# Patient Record
Sex: Male | Born: 1975 | Race: Black or African American | Hispanic: No | Marital: Married | Smoking: Never smoker
Health system: Southern US, Community
[De-identification: ages and names within clinical notes are randomized; demographics above are authoritative.]

---

## 2013-12-06 ENCOUNTER — Ambulatory Visit (INDEPENDENT_AMBULATORY_CARE_PROVIDER_SITE_OTHER): Payer: BC Managed Care – PPO | Admitting: Sports Medicine

## 2013-12-06 ENCOUNTER — Encounter: Payer: Self-pay | Admitting: Sports Medicine

## 2013-12-06 VITALS — BP 121/80 | HR 71 | Wt 263.0 lb

## 2013-12-06 DIAGNOSIS — Z299 Encounter for prophylactic measures, unspecified: Secondary | ICD-10-CM

## 2013-12-06 DIAGNOSIS — Z23 Encounter for immunization: Secondary | ICD-10-CM

## 2013-12-06 DIAGNOSIS — Z0000 Encounter for general adult medical examination without abnormal findings: Secondary | ICD-10-CM | POA: Insufficient documentation

## 2013-12-06 NOTE — Progress Notes (Signed)
  Subjective:    CC: Establish care/CPE  HPI:  This pleasant 38 year old male has no complaints, no medical problems, and just desires to establish care.  Past medical history, Surgical history, Family history not pertinant except as noted below, Social history, Allergies, and medications have been entered into the medical record, reviewed, and no changes needed.   Review of Systems: No headache, visual changes, nausea, vomiting, diarrhea, constipation, dizziness, abdominal pain, skin rash, fevers, chills, night sweats, swollen lymph nodes, weight loss, chest pain, body aches, joint swelling, muscle aches, shortness of breath, mood changes, visual or auditory hallucinations.  Objective:    General: Well Developed, well nourished, and in no acute distress.  Neuro: Alert and oriented x3, extra-ocular muscles intact, sensation grossly intact.  HEENT: Normocephalic, atraumatic, pupils equal round reactive to light, neck supple, no masses, no lymphadenopathy, thyroid nonpalpable. Oropharynx, nasopharynx, external ear canals are unremarkable. Skin: Warm and dry, no rashes noted.  Cardiac: Regular rate and rhythm, no murmurs rubs or gallops.  Respiratory: Clear to auscultation bilaterally. Not using accessory muscles, speaking in full sentences.  Abdominal: Soft, nontender, nondistended, positive bowel sounds, no masses, no organomegaly.  Musculoskeletal: Shoulder, elbow, wrist, hip, knee, ankle stable, and with full range of motion.  Impression and Recommendations:    The patient was counselled, risk factors were discussed, anticipatory guidance given.

## 2013-12-06 NOTE — Assessment & Plan Note (Signed)
This is a healthy male with no complaints, checking routine blood work. Tdap. Complete physical performed today. Return to see me in one year.

## 2015-01-31 ENCOUNTER — Encounter: Payer: Self-pay | Admitting: Sports Medicine

## 2015-01-31 ENCOUNTER — Ambulatory Visit (INDEPENDENT_AMBULATORY_CARE_PROVIDER_SITE_OTHER): Payer: BLUE CROSS/BLUE SHIELD | Admitting: Sports Medicine

## 2015-01-31 VITALS — BP 109/65 | HR 70 | Ht 76.0 in | Wt 262.0 lb

## 2015-01-31 DIAGNOSIS — B360 Pityriasis versicolor: Secondary | ICD-10-CM | POA: Diagnosis not present

## 2015-01-31 DIAGNOSIS — Z0000 Encounter for general adult medical examination without abnormal findings: Secondary | ICD-10-CM

## 2015-01-31 MED ORDER — FLUCONAZOLE 150 MG PO TABS: ORAL_TABLET | ORAL | Status: DC

## 2015-01-31 NOTE — Assessment & Plan Note (Signed)
Fluconazole 300 mg weekly for 4 weeks. Return in 2 months to recheck rash.

## 2015-01-31 NOTE — Progress Notes (Signed)
  Subjective:    CC: CPE  HPI:  This pleasant 39 year old male has no complaints, he is here for a physical.  Past medical history, Surgical history, Family history not pertinant except as noted below, Social history, Allergies, and medications have been entered into the medical record, reviewed, and no changes needed.   Review of Systems: No headache, visual changes, nausea, vomiting, diarrhea, constipation, dizziness, abdominal pain, skin rash, fevers, chills, night sweats, swollen lymph nodes, weight loss, chest pain, body aches, joint swelling, muscle aches, shortness of breath, mood changes, visual or auditory hallucinations.  Objective:    General: Well Developed, well nourished, and in no acute distress.  Neuro: Alert and oriented x3, extra-ocular muscles intact, sensation grossly intact.  HEENT: Normocephalic, atraumatic, pupils equal round reactive to light, neck supple, no masses, no lymphadenopathy, thyroid nonpalpable.  Skin: Warm and dry, there is a splotchy, hypopigmented rash on his upper shoulders and back, no pruritus, no excoriations. There is also a subcentimeter circular dark lesion on his right palm. Well-defined borders, however asymmetric and irregular. There is also a dome-shaped lesion on his scalp, it is unchanged over years. Nontender. Appears to be a small sebaceous cyst versus ingrown hair. Cardiac: Regular rate and rhythm, no murmurs rubs or gallops.  Respiratory: Clear to auscultation bilaterally. Not using accessory muscles, speaking in full sentences.  Abdominal: Soft, nontender, nondistended, positive bowel sounds, no masses, no organomegaly.  Musculoskeletal: Shoulder, elbow, wrist, hip, knee, ankle stable, and with full range of motion.  Impression and Recommendations:    The patient was counselled, risk factors were discussed, anticipatory guidance given.

## 2015-01-31 NOTE — Patient Instructions (Signed)
Tinea Versicolor Tinea versicolor is a common yeast infection of the skin. This condition becomes known when the yeast on our skin starts to overgrow (yeast is a normal inhabitant on our skin). This condition is noticed as white or light brown patches on brown skin, and is more evident in the summer on tanned skin. These areas are slightly scaly if scratched. The light patches from the yeast become evident when the yeast creates "holes in your suntan". This is most often noticed in the summer. The patches are usually located on the chest, back, pubis, neck and body folds. However, it may occur on any area of body. Mild itching and inflammation (redness or soreness) may be present. DIAGNOSIS  The diagnosisof this is made clinically (by looking). Cultures from samples are usually not needed. Examination under the microscope may help. However, yeast is normally found on skin. The diagnosis still remains clinical. Examination under Wood's Ultraviolet Light can determine the extent of the infection. TREATMENT  This common infection is usually only of cosmetic (only a concern to your appearance). It is easily treated with dandruff shampoo used during showers or bathing. Vigorous scrubbing will eliminate the yeast over several days time. The light areas in your skin may remain for weeks or months after the infection is cured unless your skin is exposed to sunlight. The lighter or darker spots caused by the fungus that remain after complete treatment are not a sign of treatment failure; it will take a long time to resolve. Your caregiver may recommend a number of commercial preparations or medication by mouth if home care is not working. Recurrence is common and preventative medication may be necessary. This skin condition is not highly contagious. Special care is not needed to protect close friends and family members. Normal hygiene is usually enough. Follow up is required only if you develop complications (such as a  secondary infection from scratching), if recommended by your caregiver, or if no relief is obtained from the preparations used. Document Released: 11/12/2000 Document Revised: 02/07/2012 Document Reviewed: 12/25/2008 [employer] Patient Information 2015 [employer]. This information is not intended to replace advice given to you by your health care provider. Make sure you discuss any questions you have with your health care provider.  

## 2015-01-31 NOTE — Assessment & Plan Note (Addendum)
Annual physical exam as above. He needs to get blood work done. We did notice a small minimally so suspicious lesion on his right hand, palm. He will take a picture of this and we can check it as return visit.

## 2015-03-31 ENCOUNTER — Ambulatory Visit (INDEPENDENT_AMBULATORY_CARE_PROVIDER_SITE_OTHER): Payer: BLUE CROSS/BLUE SHIELD | Admitting: Sports Medicine

## 2015-03-31 ENCOUNTER — Encounter: Payer: Self-pay | Admitting: Sports Medicine

## 2015-03-31 VITALS — BP 119/79 | HR 96 | Ht 76.0 in | Wt 260.0 lb

## 2015-03-31 DIAGNOSIS — B360 Pityriasis versicolor: Secondary | ICD-10-CM | POA: Diagnosis not present

## 2015-03-31 MED ORDER — FLUCONAZOLE 150 MG PO TABS: ORAL_TABLET | ORAL | Status: DC

## 2015-03-31 NOTE — Progress Notes (Signed)
  Subjective:    CC: Follow-up  HPI: Tinea versicolor: Improved however patient does endorse missing some doses.  Annual physical exam: Blood work has not yet been done, patient plans to do it today.  Past medical history, Surgical history, Family history not pertinant except as noted below, Social history, Allergies, and medications have been entered into the medical record, reviewed, and no changes needed.   Review of Systems: No fevers, chills, night sweats, weight loss, chest pain, or shortness of breath.   Objective:    General: Well Developed, well nourished, and in no acute distress.  Neuro: Alert and oriented x3, extra-ocular muscles intact, sensation grossly intact.  HEENT: Normocephalic, atraumatic, pupils equal round reactive to light, neck supple, no masses, no lymphadenopathy, thyroid nonpalpable.  Skin: Warm and dry, there are persistent hypopigmented patches over his back. Cardiac: Regular rate and rhythm, no murmurs rubs or gallops, no lower extremity edema.  Respiratory: Clear to auscultation bilaterally. Not using accessory muscles, speaking in full sentences.  Impression and Recommendations:

## 2015-03-31 NOTE — Assessment & Plan Note (Signed)
Has missed a couple doses, rash looks a little bit lighter. Adding 2 more weeks. Able take a picture of it with her cell phone today, and then again in a month, and then compare.

## 2015-04-01 ENCOUNTER — Other Ambulatory Visit: Payer: Self-pay | Admitting: Sports Medicine

## 2015-04-01 LAB — COMPREHENSIVE METABOLIC PANEL WITH GFR
Albumin: 4.5 g/dL (ref 3.5–5.2)
Alkaline Phosphatase: 58 U/L (ref 39–117)
BUN: 15 mg/dL (ref 6–23)
Calcium: 9.7 mg/dL (ref 8.4–10.5)
Chloride: 102 meq/L (ref 96–112)
Potassium: 4.2 meq/L (ref 3.5–5.3)
Sodium: 137 meq/L (ref 135–145)
Total Protein: 7.6 g/dL (ref 6.0–8.3)

## 2015-04-01 LAB — CBC
HCT: 46.1 % (ref 39.0–52.0)
Hemoglobin: 15.5 g/dL (ref 13.0–17.0)
MCH: 28.4 pg (ref 26.0–34.0)
MCHC: 33.6 g/dL (ref 30.0–36.0)
MCV: 84.6 fL (ref 78.0–100.0)
MPV: 12.9 fL — ABNORMAL HIGH (ref 8.6–12.4)
Platelets: 175 K/uL (ref 150–400)
RBC: 5.45 MIL/uL (ref 4.22–5.81)
RDW: 14.1 % (ref 11.5–15.5)
WBC: 3.9 K/uL — ABNORMAL LOW (ref 4.0–10.5)

## 2015-04-01 LAB — LIPID PANEL
Cholesterol: 186 mg/dL (ref 0–200)
HDL: 44 mg/dL (ref >=40)
LDL Cholesterol: 130 mg/dL — ABNORMAL HIGH (ref 0–99)
Total CHOL/HDL Ratio: 4.2 Ratio
Triglycerides: 62 mg/dL (ref <150)
VLDL: 12 mg/dL (ref 0–40)

## 2015-04-01 LAB — HEMOGLOBIN A1C
Hgb A1c MFr Bld: 5.7 % — ABNORMAL HIGH (ref <5.7)
Mean Plasma Glucose: 117 mg/dL — ABNORMAL HIGH (ref <117)

## 2015-04-01 LAB — VITAMIN D 25 HYDROXY (VIT D DEFICIENCY, FRACTURES): Vit D, 25-Hydroxy: 22 ng/mL — ABNORMAL LOW (ref 30–100)

## 2015-04-01 LAB — TSH: TSH: 0.97 u[IU]/mL (ref 0.350–4.500)

## 2015-04-01 LAB — COMPREHENSIVE METABOLIC PANEL
ALT: 15 U/L (ref 0–53)
AST: 18 U/L (ref 0–37)
CO2: 26 mEq/L (ref 19–32)
Creat: 1.38 mg/dL — ABNORMAL HIGH (ref 0.50–1.35)
Glucose, Bld: 99 mg/dL (ref 70–99)
Total Bilirubin: 0.7 mg/dL (ref 0.2–1.2)

## 2015-04-01 MED ORDER — VITAMIN D (ERGOCALCIFEROL) 1.25 MG (50000 UNIT) PO CAPS: 50000.0000 [IU] | ORAL_CAPSULE | ORAL | Status: DC

## 2015-10-06 ENCOUNTER — Ambulatory Visit: Payer: BLUE CROSS/BLUE SHIELD | Admitting: Sports Medicine

## 2016-02-06 ENCOUNTER — Encounter: Payer: Self-pay | Admitting: Sports Medicine

## 2016-02-06 ENCOUNTER — Ambulatory Visit (INDEPENDENT_AMBULATORY_CARE_PROVIDER_SITE_OTHER): Payer: BLUE CROSS/BLUE SHIELD | Admitting: Sports Medicine

## 2016-02-06 DIAGNOSIS — Z0000 Encounter for general adult medical examination without abnormal findings: Secondary | ICD-10-CM | POA: Diagnosis not present

## 2016-02-06 LAB — COMPREHENSIVE METABOLIC PANEL
ALT: 17 U/L (ref 9–46)
AST: 18 U/L (ref 10–40)
Albumin: 4.1 g/dL (ref 3.6–5.1)
Alkaline Phosphatase: 46 U/L (ref 40–115)
CO2: 23 mmol/L (ref 20–31)
Calcium: 9.4 mg/dL (ref 8.6–10.3)
Glucose, Bld: 87 mg/dL (ref 65–99)
Potassium: 4.2 mmol/L (ref 3.5–5.3)
Total Bilirubin: 0.6 mg/dL (ref 0.2–1.2)
Total Protein: 6.8 g/dL (ref 6.1–8.1)

## 2016-02-06 LAB — CBC
HCT: 41.9 % (ref 39.0–52.0)
Hemoglobin: 14 g/dL (ref 13.0–17.0)
MCH: 28.4 pg (ref 26.0–34.0)
MCHC: 33.4 g/dL (ref 30.0–36.0)
MCV: 85 fL (ref 78.0–100.0)
MPV: 12.8 fL — ABNORMAL HIGH (ref 8.6–12.4)
Platelets: 159 K/uL (ref 150–400)
RBC: 4.93 MIL/uL (ref 4.22–5.81)
RDW: 13.8 % (ref 11.5–15.5)
WBC: 5.6 K/uL (ref 4.0–10.5)

## 2016-02-06 LAB — LIPID PANEL
Cholesterol: 173 mg/dL (ref 125–200)
HDL: 42 mg/dL (ref >=40)
LDL Cholesterol: 120 mg/dL (ref <130)
Total CHOL/HDL Ratio: 4.1 ratio (ref <=5.0)
Triglycerides: 56 mg/dL (ref <150)
VLDL: 11 mg/dL (ref <30)

## 2016-02-06 LAB — COMPREHENSIVE METABOLIC PANEL WITH GFR
BUN: 17 mg/dL (ref 7–25)
Chloride: 104 mmol/L (ref 98–110)
Creat: 1.39 mg/dL — ABNORMAL HIGH (ref 0.60–1.35)
Sodium: 139 mmol/L (ref 135–146)

## 2016-02-06 LAB — TSH: TSH: 0.78 mIU/L (ref 0.40–4.50)

## 2016-02-06 NOTE — Assessment & Plan Note (Signed)
Complete physical as above, routine blood work ordered, return in one year.

## 2016-02-06 NOTE — Progress Notes (Addendum)
  Subjective:    CC: complete physical  HPI:  This is a pleasant 40 year old male, he is here for his physical and has no complaints.  Past medical history, Surgical history, Family history not pertinant except as noted below, Social history, Allergies, and medications have been entered into the medical record, reviewed, and no changes needed.   Review of Systems: No headache, visual changes, nausea, vomiting, diarrhea, constipation, dizziness, abdominal pain, skin rash, fevers, chills, night sweats, swollen lymph nodes, weight loss, chest pain, body aches, joint swelling, muscle aches, shortness of breath, mood changes, visual or auditory hallucinations.  Objective:    General: Well Developed, well nourished, and in no acute distress.  Neuro: Alert and oriented x3, extra-ocular muscles intact, sensation grossly intact. Cranial nerves II through XII are intact, motor, sensory, and coordinative functions are all intact. HEENT: Normocephalic, atraumatic, pupils equal round reactive to light, neck supple, no masses, no lymphadenopathy, thyroid nonpalpable. Oropharynx, nasopharynx, external ear canals are unremarkable. Skin: Warm and dry, no rashes noted.  Cardiac: Regular rate and rhythm, no murmurs rubs or gallops.  Respiratory: Clear to auscultation bilaterally. Not using accessory muscles, speaking in full sentences.  Abdominal: Soft, nontender, nondistended, positive bowel sounds, no masses, no organomegaly.  Musculoskeletal: Shoulder, elbow, wrist, hip, knee, ankle stable, and with full range of motion.  Impression and Recommendations:    The patient was counselled, risk factors were discussed, anticipatory guidance given.

## 2016-02-07 LAB — HEMOGLOBIN A1C
Hgb A1c MFr Bld: 5.7 % — ABNORMAL HIGH (ref <5.7)
Mean Plasma Glucose: 117 mg/dL — ABNORMAL HIGH (ref <117)

## 2016-02-07 LAB — VITAMIN D 25 HYDROXY (VIT D DEFICIENCY, FRACTURES): Vit D, 25-Hydroxy: 17 ng/mL — ABNORMAL LOW (ref 30–100)

## 2016-02-09 MED ORDER — VITAMIN D (ERGOCALCIFEROL) 1.25 MG (50000 UNIT) PO CAPS: 50000.0000 [IU] | ORAL_CAPSULE | ORAL | Status: DC

## 2016-02-09 NOTE — Addendum Note (Signed)
Addended by: Monica BectonHEKKEKANDAM, Jakel Alphin J on: 02/09/2016 09:03 AM   Modules accepted: Orders

## 2016-03-30 ENCOUNTER — Encounter: Payer: Self-pay | Admitting: Sports Medicine

## 2016-03-30 ENCOUNTER — Ambulatory Visit (INDEPENDENT_AMBULATORY_CARE_PROVIDER_SITE_OTHER): Payer: BLUE CROSS/BLUE SHIELD | Admitting: Sports Medicine

## 2016-03-30 VITALS — BP 125/80 | HR 60 | Resp 18 | Wt 272.2 lb

## 2016-03-30 DIAGNOSIS — L259 Unspecified contact dermatitis, unspecified cause: Secondary | ICD-10-CM | POA: Diagnosis not present

## 2016-03-30 DIAGNOSIS — Z0000 Encounter for general adult medical examination without abnormal findings: Secondary | ICD-10-CM | POA: Diagnosis not present

## 2016-03-30 DIAGNOSIS — S76302A Unspecified injury of muscle, fascia and tendon of the posterior muscle group at thigh level, left thigh, initial encounter: Secondary | ICD-10-CM | POA: Diagnosis not present

## 2016-03-30 MED ORDER — TRIAMCINOLONE ACETONIDE 0.5 % EX CREA: 1.0000 "application " | TOPICAL_CREAM | Freq: Two times a day (BID) | CUTANEOUS | Status: DC

## 2016-03-30 NOTE — Assessment & Plan Note (Addendum)
Patient should strap this with compressive dressing,   And do hamstring rehabilitation exercises.

## 2016-03-30 NOTE — Assessment & Plan Note (Signed)
His friend had some issues, with a thin heart wall, no further details. Brandon Wood is understandably worried about himself though he is completely asymptomatic.  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

## 2016-03-30 NOTE — Progress Notes (Signed)
  Subjective:    CC: Follow-up  HPI: This is a pleasant 40 year old male, for the past week he's had an on rash on the left side of his arm, he is not sure what happened but it looks like he scratched his arm on something, since then it's been minimally itchy, without pain.  Left hamstring injury: Has been working out heavily, has had a steady increase in pain in his mid left hamstring, mild, persistent, no radiation, no overt trauma.  Worried well:  His friend had some issues, with a thin heart wall, no further details. Kaz is understandably worried about himself though he is completely asymptomatic.  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  Past medical history, Surgical history, Family history not pertinant except as noted below, Social history, Allergies, and medications have been entered into the medical record, reviewed, and no changes needed.   Review of Systems: No fevers, chills, night sweats, weight loss, chest pain, or shortness of breath.   Objective:    General: Well Developed, well nourished, and in no acute distress.  Neuro: Alert and oriented x3, extra-ocular muscles intact, sensation grossly intact.  HEENT: Normocephalic, atraumatic, pupils equal round reactive to light, neck supple, no masses, no lymphadenopathy, thyroid nonpalpable.  Skin: Warm and dry, no rashes. Cardiac: Regular rate and rhythm, no murmurs rubs or gallops, no lower extremity edema.  Respiratory: Clear to auscultation bilaterally. Not using accessory muscles, speaking in full sentences. Left Knee: Normal to inspection with no erythema or effusion or obvious bony abnormalities. Palpation normal with no warmth or joint line tenderness or patellar tenderness or condyle tenderness. ROM normal in flexion and extension and lower leg rotation. Ligaments with solid consistent endpoints including ACL, PCL, LCL,  MCL. Negative Mcmurray's and provocative meniscal tests. Non painful patellar compression. Patellar and quadriceps tendons unremarkable. Hamstring and quadriceps strength is normal. Tender to palpation along the mid hamstring. Strength is normal but there is pain with resisted knee flexion.  Impression and Recommendations:

## 2016-03-30 NOTE — Assessment & Plan Note (Signed)
Triamcinolone cream.

## 2016-04-27 ENCOUNTER — Ambulatory Visit (INDEPENDENT_AMBULATORY_CARE_PROVIDER_SITE_OTHER): Payer: BLUE CROSS/BLUE SHIELD | Admitting: Sports Medicine

## 2016-04-27 VITALS — BP 113/74 | HR 55 | Resp 18 | Wt 269.6 lb

## 2016-04-27 DIAGNOSIS — L259 Unspecified contact dermatitis, unspecified cause: Secondary | ICD-10-CM | POA: Diagnosis not present

## 2016-04-27 DIAGNOSIS — S76302D Unspecified injury of muscle, fascia and tendon of the posterior muscle group at thigh level, left thigh, subsequent encounter: Secondary | ICD-10-CM | POA: Diagnosis not present

## 2016-04-27 NOTE — Assessment & Plan Note (Signed)
Resolved with rehabilitation exercises 

## 2016-04-27 NOTE — Assessment & Plan Note (Signed)
Resolved with triamcinolone cream

## 2016-04-27 NOTE — Progress Notes (Signed)
  Subjective:    CC: Follow-up  HPI: Hamstring strain: Resolved.  Skin rash: Resolved.  Past medical history, Surgical history, Family history not pertinant except as noted below, Social history, Allergies, and medications have been entered into the medical record, reviewed, and no changes needed.   Review of Systems: No fevers, chills, night sweats, weight loss, chest pain, or shortness of breath.   Objective:    General: Well Developed, well nourished, and in no acute distress.  Neuro: Alert and oriented x3, extra-ocular muscles intact, sensation grossly intact.  HEENT: Normocephalic, atraumatic, pupils equal round reactive to light, neck supple, no masses, no lymphadenopathy, thyroid nonpalpable.  Skin: Warm and dry, no rashes. Cardiac: Regular rate and rhythm, no murmurs rubs or gallops, no lower extremity edema.  Respiratory: Clear to auscultation bilaterally. Not using accessory muscles, speaking in full sentences.  Impression and Recommendations:

## 2016-08-20 ENCOUNTER — Encounter: Payer: Self-pay | Admitting: Sports Medicine

## 2016-08-20 ENCOUNTER — Ambulatory Visit (INDEPENDENT_AMBULATORY_CARE_PROVIDER_SITE_OTHER): Payer: BLUE CROSS/BLUE SHIELD | Admitting: Sports Medicine

## 2016-08-20 ENCOUNTER — Ambulatory Visit (INDEPENDENT_AMBULATORY_CARE_PROVIDER_SITE_OTHER): Payer: BLUE CROSS/BLUE SHIELD

## 2016-08-20 DIAGNOSIS — M2241 Chondromalacia patellae, right knee: Secondary | ICD-10-CM | POA: Diagnosis not present

## 2016-08-20 DIAGNOSIS — M25561 Pain in right knee: Secondary | ICD-10-CM | POA: Diagnosis not present

## 2016-08-20 DIAGNOSIS — M25562 Pain in left knee: Secondary | ICD-10-CM | POA: Diagnosis not present

## 2016-08-20 MED ORDER — DICLOFENAC SODIUM 75 MG PO TBEC: 75.0000 mg | DELAYED_RELEASE_TABLET | Freq: Two times a day (BID) | ORAL | 3 refills | Status: DC

## 2016-08-20 NOTE — Progress Notes (Signed)
  Subjective:    CC: Right knee pain  HPI: For a few weeks now this pleasant 40 year old male has had increasing pain and swelling behind the right kneecap, worse going up and down stairs and bending his knee, moderate, persistent without radiation or mechanical symptoms, no trauma, no constitutional symptoms.  Past medical history:  Negative.  See flowsheet/record as well for more information.  Surgical history: Negative.  See flowsheet/record as well for more information.  Family history: Negative.  See flowsheet/record as well for more information.  Social history: Negative.  See flowsheet/record as well for more information.  Allergies, and medications have been entered into the medical record, reviewed, and no changes needed.   Review of Systems: No fevers, chills, night sweats, weight loss, chest pain, or shortness of breath.   Objective:    General: Well Developed, well nourished, and in no acute distress.  Neuro: Alert and oriented x3, extra-ocular muscles intact, sensation grossly intact.  HEENT: Normocephalic, atraumatic, pupils equal round reactive to light, neck supple, no masses, no lymphadenopathy, thyroid nonpalpable.  Skin: Warm and dry, no rashes. Cardiac: Regular rate and rhythm, no murmurs rubs or gallops, no lower extremity edema.  Respiratory: Clear to auscultation bilaterally. Not using accessory muscles, speaking in full sentences. Right Knee: Visibly swollen with a palpable fluid wave, tenderness at the medial and lateral patellar facets ROM normal in flexion and extension and lower leg rotation. Ligaments with solid consistent endpoints including ACL, PCL, LCL, MCL. Negative Mcmurray's and provocative meniscal tests. Painful patellar compression with crepitus Patellar and quadriceps tendons unremarkable. Hamstring and quadriceps strength is normal.  Procedure: Real-time Ultrasound Guided aspiration/injection of right knee Device: GE Logiq E  Verbal informed  consent obtained.  Time-out conducted.  Noted no overlying erythema, induration, or other signs of local infection.  Skin prepped in a sterile fashion.  Local anesthesia: Topical Ethyl chloride.  With sterile technique and under real time ultrasound guidance:  Aspirated 7 mL straw-colored fluid, syringe switched and 1 mL kenalog 40, 2 mL lidocaine, 2 mL Marcaine injected easily. Completed without difficulty  Pain immediately resolved suggesting accurate placement of the medication.  Advised to call if fevers/chills, erythema, induration, drainage, or persistent bleeding.  Images permanently stored and available for review in the ultrasound unit.  Impression: Technically successful ultrasound guided injection.  X-rays personally reviewed and show mild medial joint space narrowing  Impression and Recommendations:    Chondromalacia of right patellofemoral joint Aspiration and injection. X-rays. Physical therapy. Diclofenac. Return to see me in one month.

## 2016-08-20 NOTE — Assessment & Plan Note (Signed)
Aspiration and injection. X-rays. Physical therapy. Diclofenac. Return to see me in one month.

## 2016-08-30 ENCOUNTER — Encounter: Payer: Self-pay | Admitting: Rehabilitative and Restorative Service Providers"

## 2016-08-30 ENCOUNTER — Ambulatory Visit (INDEPENDENT_AMBULATORY_CARE_PROVIDER_SITE_OTHER): Payer: BLUE CROSS/BLUE SHIELD | Admitting: Rehabilitative and Restorative Service Providers"

## 2016-08-30 DIAGNOSIS — M25561 Pain in right knee: Secondary | ICD-10-CM

## 2016-08-30 DIAGNOSIS — R293 Abnormal posture: Secondary | ICD-10-CM | POA: Diagnosis not present

## 2016-08-30 DIAGNOSIS — R29898 Other symptoms and signs involving the musculoskeletal system: Secondary | ICD-10-CM | POA: Diagnosis not present

## 2016-08-30 NOTE — Therapy (Signed)
[facility] [address] [place], [place], [address] Phone: [phone]   Fax:  [phone]  Physical Therapy Evaluation  Patient Details  Name: Brandon Wood MRN: [id number] Date of Birth: 1976-07-30 Referring Provider: Dr Benjamin Stain   Encounter Date: 08/30/2016      PT End of Session - 08/30/16 0845    Visit Number 1   Number of Visits 12   Date for PT Re-Evaluation 10/11/16   PT Start Time 0845   PT Stop Time 0932   PT Time Calculation (min) 47 min   Activity Tolerance Patient tolerated treatment well      History reviewed. No pertinent past medical history.  History reviewed. No pertinent surgical history.  There were no vitals filed for this visit.       Subjective Assessment - 08/30/16 0847    Subjective Patient reports that he was jogging on the treadmill 08/18/16 when he felt a sharp pain in the Rt knee - which did not resolv. Seen by MD and referred to PT. Still having some pain in the Rt knee with running on the treadmill and some with squats. Notices some discomfort during the day when he is not taking the medication.    Pertinent History intermittent knee pain  - mild    Diagnostic tests Xrays - mild arthritis    Patient Stated Goals return to normal activities without knee pain    Currently in Pain? Yes   Pain Score 1    Pain Location Knee   Pain Orientation Right   Pain Descriptors / Indicators Sore   Pain Type Acute pain   Pain Onset 1 to 4 weeks ago   Pain Frequency Intermittent   Aggravating Factors  running on the treadmill; squats; ascending or descending stairs; moving sit to stand    Pain Relieving Factors medication; rest             [facility] PT Assessment - 08/30/16 0001      Assessment   Medical Diagnosis chondromalacia patella Rt    Referring Provider Dr Benjamin Stain    Onset Date/Surgical Date 08/18/16   Hand Dominance Left   Next MD Visit 09/17/16   Prior Therapy none      Precautions   Precautions None     Balance Screen   Has the patient fallen in the past 6 months No   Has the patient had a decrease in activity level because of a fear of falling?  No   Is the patient reluctant to leave their home because of a fear of falling?  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    Leisure working out at [employer] 3-4 days/wk inc treadmill and machines; yard work     Observation/Other Assessments   Focus on Therapeutic Outcomes (FOTO)  34% limitation      Sensation   Additional Comments WFL's per pt report - one episode of numbness in the Lt LE a few weeks ago resolved in a few seconds      Posture/Postural Control   Posture Comments standing with knees hyoerextended bilaterally Rt > Lt      AROM   Overall AROM Comments WFL's bilat LE's      Strength   Overall Strength Comments 5/5 bilat LE's  Flexibility   Hamstrings tight ~ 75 deg    Quadriceps tight bilat Rt > Lt    ITB tight Rt > Lt    Piriformis tight Rt > Lt      Palpation   Patella mobility laterally resting and tracking patella    Palpation comment tight lateral quad through muscle belly and into the insertion at patella Rt > Lt      Special Tests    Special Tests --  pain with patella grind Rt > Lt                    [facility] Adult PT Treatment/Exercise - 08/30/16 0001      Therapeutic Activites    Therapeutic Activities --  myofacial and deep tissue work through lateral quad      Neuro Re-ed    Neuro Re-ed Details  working on posture and alignment standing without hyperextension on knees      Knee/Hip Exercises: Stretches   Passive Hamstring Stretch 3 reps;30 seconds   Quad Stretch 3 reps;30 seconds   Hip Flexor Stretch 2 reps;30 seconds  supine LE off edge of bed   ITB Stretch 2 reps;30 seconds    Piriformis Stretch 3 reps;30 seconds                PT Education - 08/30/16 0923    Education provided Yes   Education Details HEP - standint without locking knees    Person(s) Educated Patient   Methods Explanation;Demonstration;Tactile cues;Verbal cues;Handout   Comprehension Verbalized understanding;Returned demonstration;Verbal cues required;Tactile cues required             PT Long Term Goals - 08/30/16 0847      PT LONG TERM GOAL #1   Title Improve FOTO to </= 25% limitation 10/11/16   Time 6   Period Weeks   Status New     PT LONG TERM GOAL #2   Title Independent in HEP 10/11/16   Time 6   Period Weeks   Status New     PT LONG TERM GOAL #3   Title Improve standing posture and alignment with patient to demonstrated standing wituout hyperextension of knees 10/11/16   Time 6   Period Weeks   Status New     PT LONG TERM GOAL #4   Title Improve patella tracking to decrease lateral tracking 10/11/16   Time 6   Period Weeks   Status New     PT LONG TERM GOAL #5   Title Return to normal functional activity including workout at the gym without pain/limitation 10/11/16   Time 6   Period Weeks   Status New               Plan - 08/30/16 1055    Clinical Impression Statement Patient presents with bilat knee pain/discomfort Rt > Lt. He stands with knees hyperextended Rt > Lt. Lateral quad and IT band are tight with lateral position and lateral tracking patella noted Rt > Lt. There is tightness through LE musculature and pain with jogging; walking; stairs; squats; some exercise at gym.    Rehab Potential Good   PT Frequency 2x / week   PT Duration 6 weeks   PT Treatment/Interventions Patient/family education;ADLs/Self Care Home Management;Neuromuscular re-education;Cryotherapy;Electrical Stimulation;Iontophoresis 4mg /ml Dexamethasone;Moist Heat;Traction;Ultrasound;Dry needling;Manual techniques;Therapeutic activities;Therapeutic exercise   PT Next  Visit Plan taping for lateral tracking patella(pt to shave knees); progress with stretching lateral hip/knee structures; strengthening medial quad; neuromuscular re-ed avoiding hyperextension  of knees in standing; may consider TDN for lateral distal quad - not mentioned to pt - will try release work with edge tool and/or manually(pt will work on myofacial release work for lateral quad at home with the stick and golf ball)   Consulted and Agree with Plan of Care Patient      Patient will benefit from skilled therapeutic intervention in order to improve the following deficits and impairments:  Postural dysfunction, Improper body mechanics, Pain, Decreased range of motion, Decreased mobility, Increased muscle spasms, Decreased activity tolerance  Visit Diagnosis: Acute pain of right knee - Plan: PT plan of care cert/re-cert  Other symptoms and signs involving the musculoskeletal system - Plan: PT plan of care cert/re-cert  Abnormal posture - Plan: PT plan of care cert/re-cert     Problem List Patient Active Problem List   Diagnosis Date Noted  . Chondromalacia of right patellofemoral joint 08/20/2016  . Contact dermatitis 03/30/2016  . Left hamstring injury 03/30/2016  . Tinea versicolor 01/31/2015  . Annual physical exam 12/06/2013    Taya Ashbaugh Rober Minion PT, MPH  08/30/2016, 11:08 AM  [facility] [address] [place], [place], [address] Phone: [phone]   Fax:  [phone]  Name: Zorawar Strollo MRN: [id number] Date of Birth: 1976-04-07

## 2016-08-30 NOTE — Patient Instructions (Signed)
Stretch out strap - check [employer]  The stick   HIP: Hamstrings - Supine   Place strap around foot. Raise leg up, keeping knee straight.  Bend opposite knee to protect back if indicated. Hold 30 seconds. 3 reps per set, 2-3 sets per day     Outer Hip Stretch: Reclined IT Band Stretch (Strap)   Strap around one foot, pull leg across body until you feel a pull or stretch, with shoulders on mat. Hold for 30 seconds. Repeat 3 times each leg. 2-3 times/day.  Piriformis Stretch   Lying on back, pull right knee toward opposite shoulder. Hold 30 seconds. Repeat 3 times. Do 2-3 sessions per day.   Quads / HF, Supine   Lie near edge of bed, pull both knees up toward chest. Hold one knee as you drop the other leg off the edge of the bed.  Relax hanging knee/can bend knee back if indicated. Hold 30 seconds. Repeat 3 times per session. Do 2-3 sessions per day.    Quads / HF, Prone   Lie face down. Grasp one ankle with same-side hand. Use towel if needed to reach. Gently pull foot toward buttock.  Hold 30 seconds. Repeat 3 times per session. Do 2-3 sessions per day.

## 2016-09-06 ENCOUNTER — Ambulatory Visit (INDEPENDENT_AMBULATORY_CARE_PROVIDER_SITE_OTHER): Payer: BLUE CROSS/BLUE SHIELD | Admitting: Physical Therapy

## 2016-09-06 DIAGNOSIS — M25561 Pain in right knee: Secondary | ICD-10-CM | POA: Diagnosis not present

## 2016-09-06 DIAGNOSIS — R29898 Other symptoms and signs involving the musculoskeletal system: Secondary | ICD-10-CM

## 2016-09-06 DIAGNOSIS — R293 Abnormal posture: Secondary | ICD-10-CM | POA: Diagnosis not present

## 2016-09-06 NOTE — Therapy (Signed)
[facility] [address] [place], [place], [address] Phone: [phone]   Fax:  [phone]  Physical Therapy Treatment  Patient Details  Name: Brandon Wood MRN: [id number] Date of Birth: 1976-10-06 Referring Provider: Dr. Briant Siteshekkekandem,   Encounter Date: 09/06/2016      PT End of Session - 09/06/16 1147    Visit Number 2   Number of Visits 12   Date for PT Re-Evaluation 10/11/16   PT Start Time 1102   PT Stop Time 1147   PT Time Calculation (min) 45 min      No past medical history on file.  No past surgical history on file.  There were no vitals filed for this visit.      Subjective Assessment - 09/06/16 1107    Subjective Pt reports he has been doing HEP 1x/day.   He hasn't had the pain like he did.  He shaved his knee in preparation of taping.   Currently in Pain? No/denies   Pain Score 0-No pain   Pain Location Knee   Pain Orientation Right            [facility] PT Assessment - 09/06/16 0001      Assessment   Medical Diagnosis chondromalacia patella Rt    Referring Provider Dr. Briant Siteshekkekandem,    Onset Date/Surgical Date 08/18/16   Hand Dominance Left   Next MD Visit 09/17/16   Prior Therapy none           [facility] Adult PT Treatment/Exercise - 09/06/16 0001      Knee/Hip Exercises: Stretches   Passive Hamstring Stretch 3 reps;30 seconds   Quad Stretch 3 reps;30 seconds   Hip Flexor Stretch 2 reps;30 seconds  supine LE off edge of bed   ITB Stretch 2 reps;30 seconds  standing, crossed legs   Piriformis Stretch 3 reps;30 seconds   Gastroc Stretch Right;Left;2 reps;30 seconds   Soleus Stretch Right;Left;2 reps;30 seconds     Knee/Hip Exercises: Supine   Straight Leg Raise with External Rotation Right;2 sets;10 reps   Straight Leg Raise with External Rotation Limitations 1 set of 5 reps with hip abd/add        Manual Therapy   Manual Therapy Taping   Manual therapy comments Dynamic tape:   X strip to Rt knee (horse pattern craddling patella) and I strip gently pulling patella medially. Tape applied to assist in improved patellar tracking.      Bike: 5 min @ L3 for warm up.         PT Education - 09/06/16 1148    Education Details HEP - added SLR with ER.    Person(s) Educated Patient   Methods Explanation   Comprehension Verbalized understanding;Returned demonstration             PT Long Term Goals - 08/30/16 0847      PT LONG TERM GOAL #1   Title Improve FOTO to </= 25% limitation 10/11/16   Time 6   Period Weeks   Status New     PT LONG TERM GOAL #2   Title Independent in HEP 10/11/16   Time 6   Period Weeks   Status New     PT LONG TERM GOAL #3   Title Improve standing posture and alignment with patient to demonstrated standing wituout hyperextension of knees 10/11/16   Time 6   Period Weeks   Status New     PT LONG TERM GOAL #4   Title  Improve patella tracking to decrease lateral tracking 10/11/16   Time 6   Period Weeks   Status New     PT LONG TERM GOAL #5   Title Return to normal functional activity including workout at the gym without pain/limitation 10/11/16   Time 6   Period Weeks   Status New               Plan - 09/06/16 1148    Clinical Impression Statement Pt tolerated all exercises well without increase in pain.  Pt found SLR with ER difficult.  Trial of dynamic tape for improved patellar tracking initiated today.     Rehab Potential Good   PT Frequency 2x / week   PT Treatment/Interventions Patient/family education;ADLs/Self Care Home Management;Neuromuscular re-education;Cryotherapy;Electrical Stimulation;Iontophoresis 4mg /ml Dexamethasone;Moist Heat;Traction;Ultrasound;Dry needling;Manual techniques;Therapeutic activities;Therapeutic exercise   PT Next Visit Plan Assess response to tape.  Trial of walk/jog on treadmill.  Continue progressive strengthening VMO, stretches for RLE.    Consulted and Agree with Plan of  Care Patient      Patient will benefit from skilled therapeutic intervention in order to improve the following deficits and impairments:  Postural dysfunction, Improper body mechanics, Pain, Decreased range of motion, Decreased mobility, Increased muscle spasms, Decreased activity tolerance  Visit Diagnosis: Acute pain of right knee  Other symptoms and signs involving the musculoskeletal system  Abnormal posture     Problem List Patient Active Problem List   Diagnosis Date Noted  . Chondromalacia of right patellofemoral joint 08/20/2016  . Contact dermatitis 03/30/2016  . Left hamstring injury 03/30/2016  . Tinea versicolor 01/31/2015  . Annual physical exam 12/06/2013   Mayer Camel, PTA 09/06/16 12:16 PM  [facility] [place] [address] [place], [place], [address] Phone: [phone]   Fax:  [phone]  Name: Brandon Wood MRN: [id number] Date of Birth: 05/28/1976

## 2016-09-06 NOTE — Patient Instructions (Signed)
Straight Leg Raise: With External Leg Rotation    Lie on back with right leg straight, opposite leg bent. Rotate straight leg out and lift _8-10___ inches. Repeat __10__ times per set. Do __2-3__ sets per session. Do __1__ sessions per day.   [facility] [address] [place], [place] [address]  [phone] (office) [phone] (fax)

## 2016-09-10 ENCOUNTER — Encounter: Payer: BLUE CROSS/BLUE SHIELD | Admitting: Physical Therapy

## 2016-09-17 ENCOUNTER — Ambulatory Visit: Payer: BLUE CROSS/BLUE SHIELD | Admitting: Sports Medicine

## 2016-09-20 ENCOUNTER — Ambulatory Visit: Payer: BLUE CROSS/BLUE SHIELD | Admitting: Sports Medicine

## 2016-09-21 ENCOUNTER — Ambulatory Visit (INDEPENDENT_AMBULATORY_CARE_PROVIDER_SITE_OTHER): Payer: BLUE CROSS/BLUE SHIELD | Admitting: Sports Medicine

## 2016-09-21 ENCOUNTER — Encounter: Payer: Self-pay | Admitting: Sports Medicine

## 2016-09-21 DIAGNOSIS — Z0000 Encounter for general adult medical examination without abnormal findings: Secondary | ICD-10-CM

## 2016-09-21 DIAGNOSIS — Z23 Encounter for immunization: Secondary | ICD-10-CM

## 2016-09-21 DIAGNOSIS — M2241 Chondromalacia patellae, right knee: Secondary | ICD-10-CM | POA: Diagnosis not present

## 2016-09-21 NOTE — Assessment & Plan Note (Signed)
Resolved after injection and physical therapy and doesn't even need Voltaren anymore.

## 2016-09-21 NOTE — Progress Notes (Signed)
  Subjective:    CC: Follow-up  HPI: Right knee pain: Pain was predominantly patellofemoral, this resolved after injection, physical therapy. He hasn't even needed to use Voltaren.  Annual physical exam: Flu shot needs to be given today.  Past medical history:  Negative.  See flowsheet/record as well for more information.  Surgical history: Negative.  See flowsheet/record as well for more information.  Family history: Negative.  See flowsheet/record as well for more information.  Social history: Negative.  See flowsheet/record as well for more information.  Allergies, and medications have been entered into the medical record, reviewed, and no changes needed.   Review of Systems: No fevers, chills, night sweats, weight loss, chest pain, or shortness of breath.   Objective:    General: Well Developed, well nourished, and in no acute distress.  Neuro: Alert and oriented x3, extra-ocular muscles intact, sensation grossly intact.  HEENT: Normocephalic, atraumatic, pupils equal round reactive to light, neck supple, no masses, no lymphadenopathy, thyroid nonpalpable.  Skin: Warm and dry, no rashes. Cardiac: Regular rate and rhythm, no murmurs rubs or gallops, no lower extremity edema.  Respiratory: Clear to auscultation bilaterally. Not using accessory muscles, speaking in full sentences.  Impression and Recommendations:    Chondromalacia of right patellofemoral joint Resolved after injection and physical therapy and doesn't even need Voltaren anymore.  Annual physical exam Flu shot today.

## 2016-09-21 NOTE — Assessment & Plan Note (Signed)
Flu shot today 

## 2016-09-23 ENCOUNTER — Ambulatory Visit (INDEPENDENT_AMBULATORY_CARE_PROVIDER_SITE_OTHER): Payer: BLUE CROSS/BLUE SHIELD | Admitting: Physical Therapy

## 2016-09-23 DIAGNOSIS — M25561 Pain in right knee: Secondary | ICD-10-CM | POA: Diagnosis not present

## 2016-09-23 DIAGNOSIS — R29898 Other symptoms and signs involving the musculoskeletal system: Secondary | ICD-10-CM

## 2016-09-23 DIAGNOSIS — R293 Abnormal posture: Secondary | ICD-10-CM

## 2016-09-23 NOTE — Therapy (Addendum)
[place], [place], [address] Phone: [phone]   Fax:  [phone]  Physical Therapy Treatment  Patient Details  Name: Brandon Wood MRN: [id number] Date of Birth: 11/25/1976 Referring Provider: Dr. Helane Rima   Encounter Date: 09/23/2016      PT End of Session - 09/23/16 1440    Visit Number 3   Number of Visits 12   Date for PT Re-Evaluation 10/11/16   PT Start Time 1402   PT Stop Time 1448   PT Time Calculation (min) 46 min   Activity Tolerance Patient tolerated treatment well;No increased pain   Behavior During Therapy [facility] for tasks assessed/performed      No past medical history on file.  No past surgical history on file.  There were no vitals filed for this visit.          [facility] PT Assessment - 09/23/16 0001      Assessment   Medical Diagnosis chondromalacia patella Rt    Referring Provider Dr. Helane Rima    Onset Date/Surgical Date 08/18/16   Hand Dominance Left   Next MD Visit PRN   Prior Therapy none     Flexibility   Hamstrings Lt 76 deg, Rt 69 deg          [facility] Adult PT Treatment/Exercise - 09/23/16 0001      Knee/Hip Exercises: Stretches   Passive Hamstring Stretch Right;Left;2 reps;30 seconds   Quad Stretch 3 reps;30 seconds   Hip Flexor Stretch 2 reps;30 seconds  supine LE off edge of bed   Piriformis Stretch 30 seconds;2 reps   Gastroc Stretch Right;Left;2 reps;30 seconds   Soleus Stretch Right;Left;2 reps;30 seconds     Knee/Hip Exercises: Aerobic   Elliptical L3.5 x 5 min      Knee/Hip Exercises: Supine   Straight Leg Raise with External Rotation Limitations 1 set of 10 reps with hip abd/add - long sitting. VC for form.     Manual Therapy   Manual Therapy Taping   Manual therapy comments Dynamic tape:  X strip to Rt knee (horse pattern craddling patella) and I strip gently pulling patella medially. Tape applied to assist in improved  patellar tracking.                 PT Education - 09/23/16 1511    Education provided Yes   Education Details Application of dynamic tape. Passenger transport manager)    Person(s) Educated Patient   Methods Explanation             PT Long Term Goals - 09/23/16 1510      PT LONG TERM GOAL #1   Title Improve FOTO to </= 25% limitation 10/11/16   Time 6   Period Weeks   Status Achieved     PT LONG TERM GOAL #2   Title Independent in HEP 10/11/16   Time 6   Period Weeks     PT LONG TERM GOAL #3   Title Improve standing posture and alignment with patient to demonstrated standing wituout hyperextension of knees 10/11/16   Time 6   Period Weeks   Status Achieved     PT LONG TERM GOAL #4   Title Improve patella tracking to decrease lateral tracking 10/11/16   Time 6   Period Weeks   Status On-going     PT LONG TERM GOAL #5   Title Return to normal functional activity including workout at the gym without pain/limitation 10/11/16   Time  6   Period Weeks   Status On-going  still has pain with walk/jog               Plan - 09/23/16 1500    Clinical Impression Statement Pt tolerated all exercises well, requiring minor VC for improved form.  Pt instructed on application of Dynamic tape for improved patellar tracking; had positive response to initial appllication.   He has met LTG # 1 and 3, is making good gains towards remaining goals.    Rehab Potential Good   PT Frequency 2x / week   PT Duration 6 weeks   PT Treatment/Interventions Patient/family education;ADLs/Self Care Home Management;Neuromuscular re-education;Cryotherapy;Electrical Stimulation;Iontophoresis 80m/ml Dexamethasone;Moist Heat;Traction;Ultrasound;Dry needling;Manual techniques;Therapeutic activities;Therapeutic exercise   PT Next Visit Plan Hold therapy per pt request, for 2 wks.  If pt returns will progress Rt knee strengthening.     Consulted and Agree with Plan of Care Patient      Patient will benefit  from skilled therapeutic intervention in order to improve the following deficits and impairments:  Postural dysfunction, Improper body mechanics, Pain, Decreased range of motion, Decreased mobility, Increased muscle spasms, Decreased activity tolerance  Visit Diagnosis: Acute pain of right knee  Other symptoms and signs involving the musculoskeletal system  Abnormal posture     Problem List Patient Active Problem List   Diagnosis Date Noted  . Chondromalacia of right patellofemoral joint 08/20/2016  . Contact dermatitis 03/30/2016  . Left hamstring injury 03/30/2016  . Tinea versicolor 01/31/2015  . Annual physical exam 12/06/2013   JKerin Perna PTA 09/23/16 3:16 PM  [facility] [place] [address]: [phone]  Fax:  [phone] Name: Brandon AshmeadMRN: [id number] of Birth: 7Nov 06, 1977 PHYSICAL THERAPY DISCHARGE SUMMARY  Visits from Start of Care: 3  Current functional level related to goals / functional outcomes: See progress note for discharge status.   Remaining deficits: See note   Education / Equipment: HEP  Plan: Patient agrees to discharge.  Patient goals were partially met.                                                  not returning since the last visit.  ?????     Celyn P. HHelene KelpPT, MPH 11/02/16 8:15 AM

## 2017-01-20 IMAGING — DX DG KNEE 1-2V*L*
4 series · 4 of 4 positions shown · non-contrast
Comparison: None.

CLINICAL DATA: Left knee pain, initial encounter, no known injury

EXAM:
LEFT KNEE - 1-2 VIEW

[knee lat]
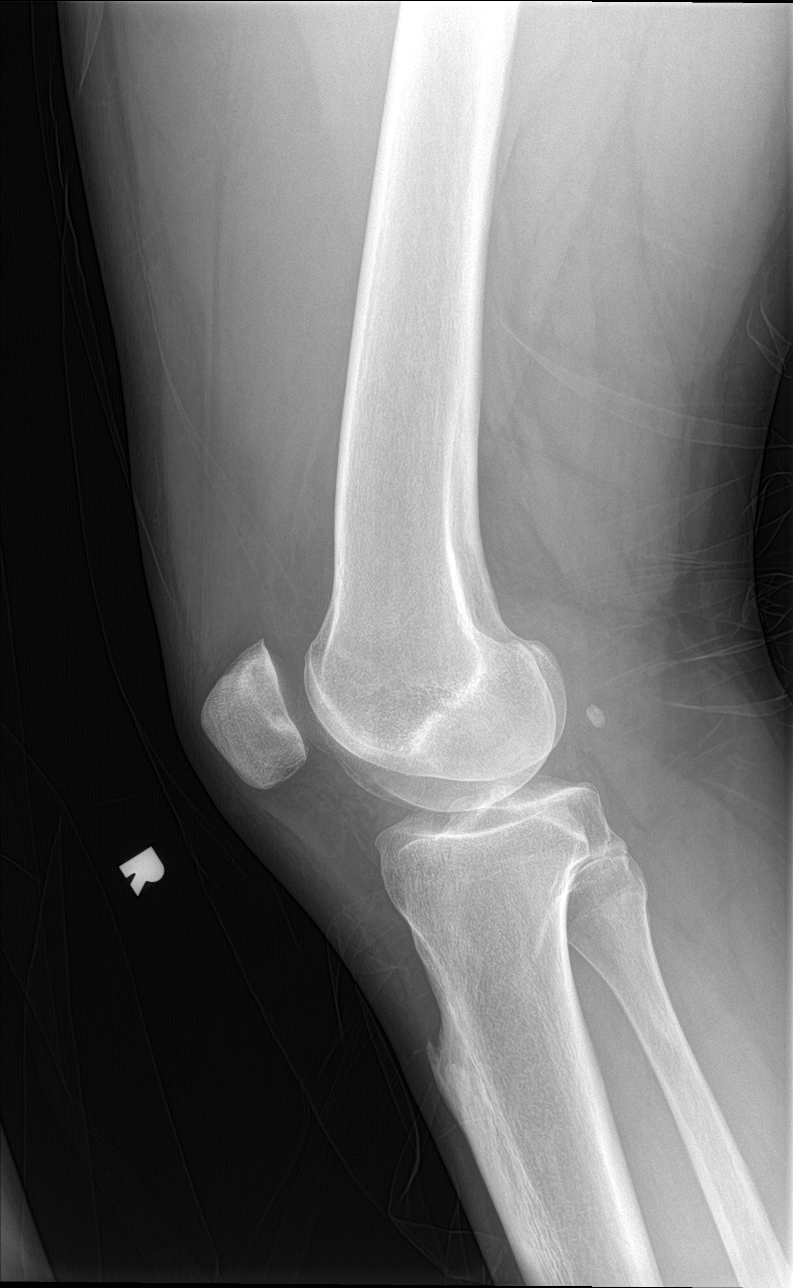

[knee sunrise]
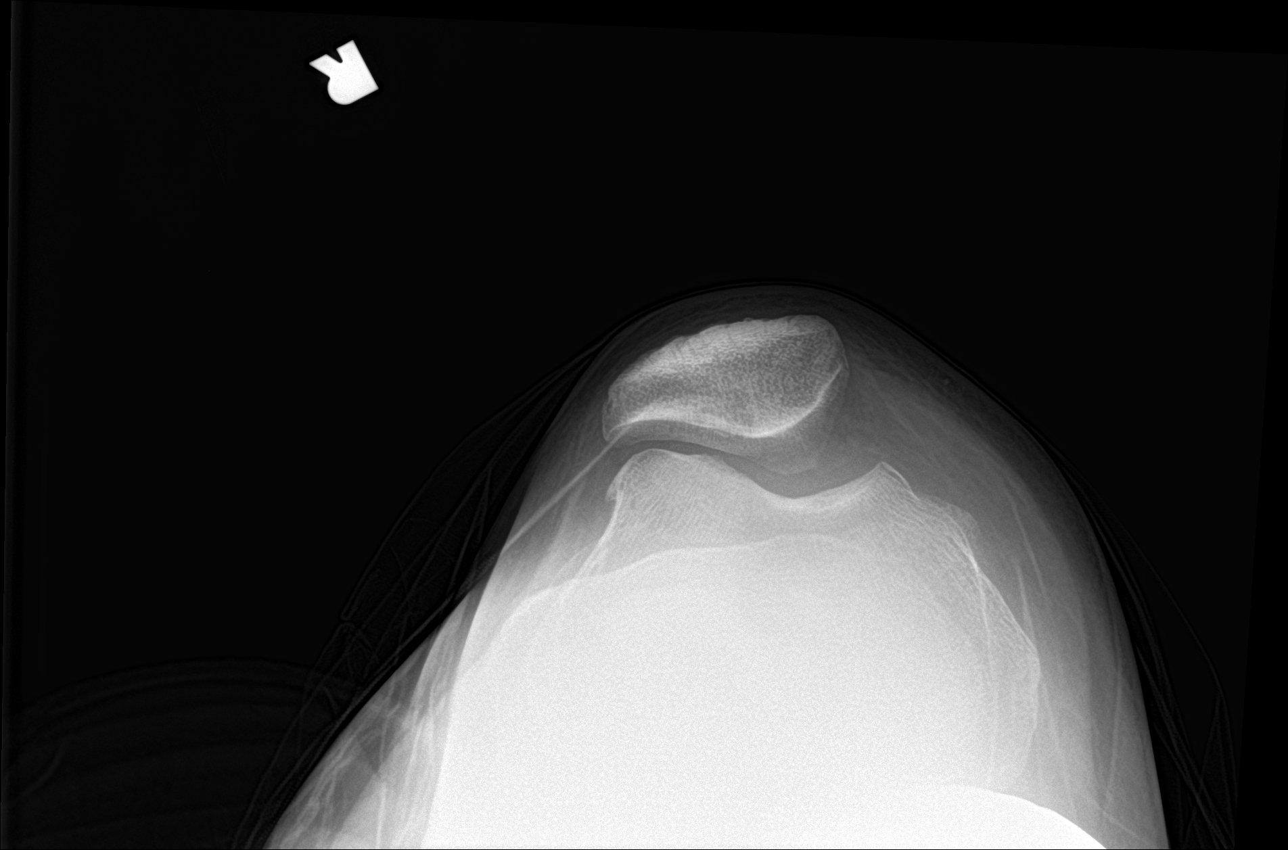

[knee ap bilat standing (1 of 2)]
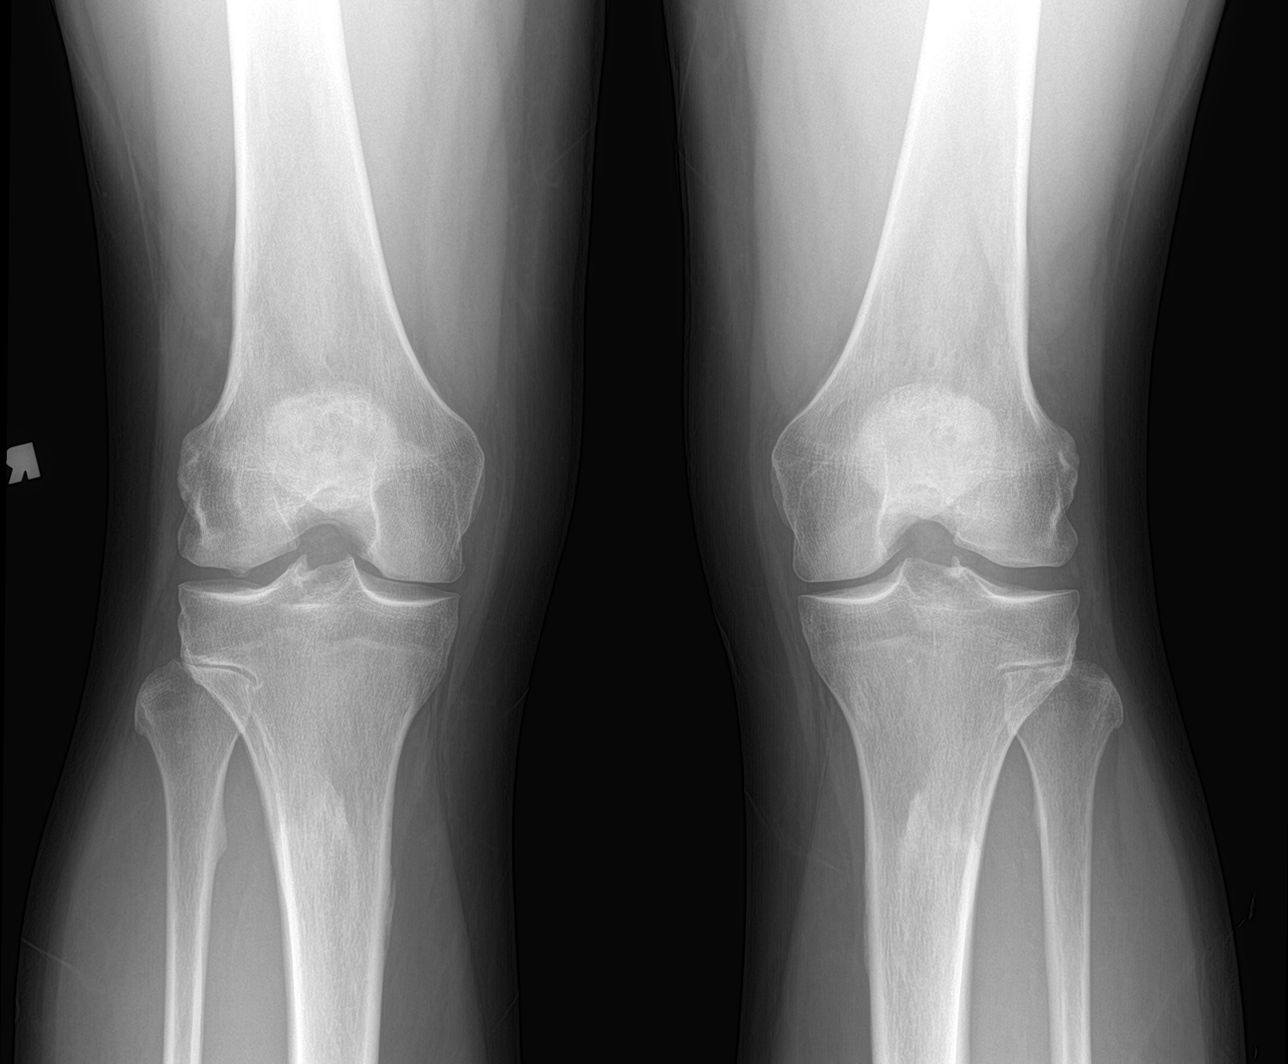

[knee ap bilat standing (2 of 2)]
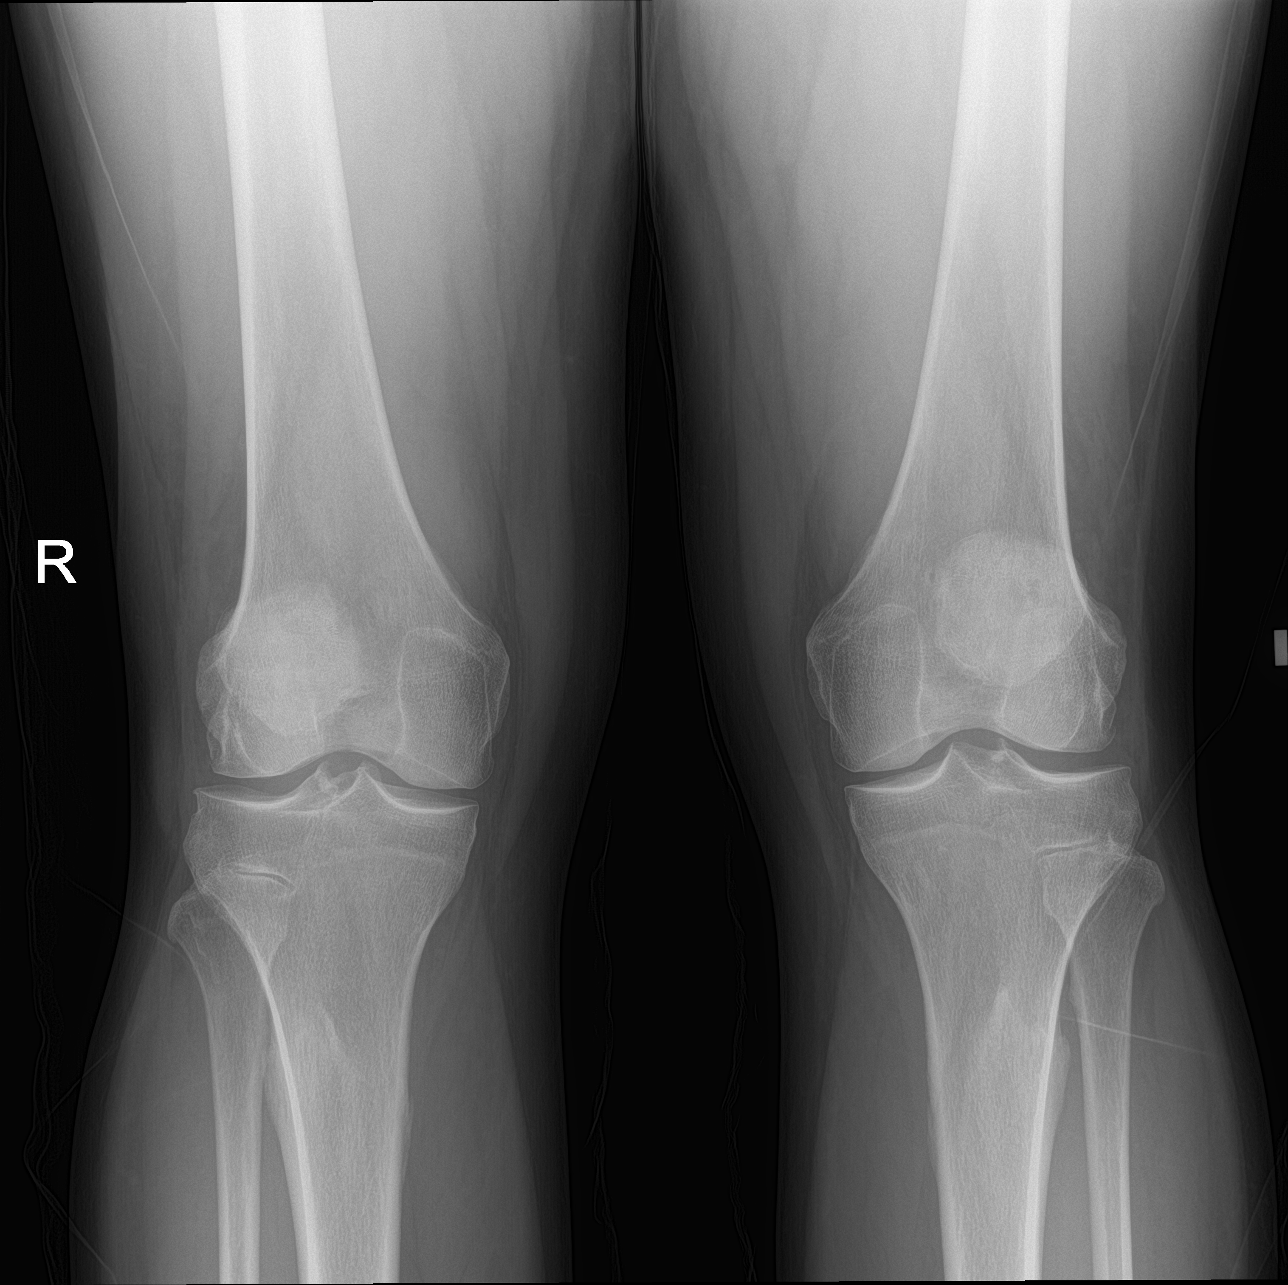

[4 of 4 positions shown; findings below may reference images not displayed]

FINDINGS: No acute fracture or dislocation is noted. Mild medial joint space
narrowing is noted similar to that seen on the right.
IMPRESSION: Mild medial joint space narrowing.  No gross abnormality is noted.

## 2017-01-31 ENCOUNTER — Ambulatory Visit (INDEPENDENT_AMBULATORY_CARE_PROVIDER_SITE_OTHER): Payer: BLUE CROSS/BLUE SHIELD | Admitting: Sports Medicine

## 2017-01-31 DIAGNOSIS — B360 Pityriasis versicolor: Secondary | ICD-10-CM

## 2017-01-31 DIAGNOSIS — Z0000 Encounter for general adult medical examination without abnormal findings: Secondary | ICD-10-CM | POA: Diagnosis not present

## 2017-01-31 LAB — COMPREHENSIVE METABOLIC PANEL
ALT: 17 U/L (ref 9–46)
Albumin: 4.4 g/dL (ref 3.6–5.1)
Alkaline Phosphatase: 53 U/L (ref 40–115)
BUN: 12 mg/dL (ref 7–25)
Creat: 1.31 mg/dL (ref 0.60–1.35)
Potassium: 4 mmol/L (ref 3.5–5.3)
Total Bilirubin: 0.6 mg/dL (ref 0.2–1.2)
Total Protein: 6.9 g/dL (ref 6.1–8.1)

## 2017-01-31 LAB — CBC
HCT: 42.9 % (ref 38.5–50.0)
Hemoglobin: 14.3 g/dL (ref 13.2–17.1)
MCH: 28.5 pg (ref 27.0–33.0)
MCHC: 33.3 g/dL (ref 32.0–36.0)
MCV: 85.6 fL (ref 80.0–100.0)
MPV: 12.1 fL (ref 7.5–12.5)
Platelets: 156 10*3/uL (ref 140–400)
RBC: 5.01 MIL/uL (ref 4.20–5.80)
RDW: 14 % (ref 11.0–15.0)
WBC: 4.5 10*3/uL (ref 3.8–10.8)

## 2017-01-31 LAB — COMPREHENSIVE METABOLIC PANEL WITH GFR
AST: 17 U/L (ref 10–40)
CO2: 28 mmol/L (ref 20–31)
Calcium: 9.8 mg/dL (ref 8.6–10.3)
Chloride: 104 mmol/L (ref 98–110)
Glucose, Bld: 91 mg/dL (ref 65–99)
Sodium: 141 mmol/L (ref 135–146)

## 2017-01-31 LAB — LIPID PANEL W/REFLEX DIRECT LDL
Cholesterol: 178 mg/dL (ref <200)
HDL: 43 mg/dL (ref >40)
LDL-Cholesterol: 121 mg/dL — ABNORMAL HIGH
Non-HDL Cholesterol (Calc): 135 mg/dL — ABNORMAL HIGH (ref <130)
Total CHOL/HDL Ratio: 4.1 ratio (ref <5.0)
Triglycerides: 45 mg/dL (ref <150)

## 2017-01-31 LAB — TSH: TSH: 0.61 mIU/L (ref 0.40–4.50)

## 2017-01-31 LAB — HEMOGLOBIN A1C
Hgb A1c MFr Bld: 5.3 % (ref <5.7)
Mean Plasma Glucose: 105 mg/dL

## 2017-01-31 NOTE — Progress Notes (Signed)
  Subjective:    CC: Annual physical exam  HPI:  This is a pleasant 41 year old male, he is here for his physical, has no complaints. He does have strong family history of prostate cancer in both his father and his paternal uncle, would like his PSA checked.  Past medical history:  Negative.  See flowsheet/record as well for more information.  Surgical history: Negative.  See flowsheet/record as well for more information.  Family history: Negative.  See flowsheet/record as well for more information.  Social history: Negative.  See flowsheet/record as well for more information.  Allergies, and medications have been entered into the medical record, reviewed, and no changes needed.    Review of Systems: No headache, visual changes, nausea, vomiting, diarrhea, constipation, dizziness, abdominal pain, skin rash, fevers, chills, night sweats, swollen lymph nodes, weight loss, chest pain, body aches, joint swelling, muscle aches, shortness of breath, mood changes, visual or auditory hallucinations.  Objective:    General: Well Developed, well nourished, and in no acute distress.  Neuro: Alert and oriented x3, extra-ocular muscles intact, sensation grossly intact. Cranial nerves II through XII are intact, motor, sensory, and coordinative functions are all intact. HEENT: Normocephalic, atraumatic, pupils equal round reactive to light, neck supple, no masses, no lymphadenopathy, thyroid nonpalpable. Oropharynx, nasopharynx, external ear canals are unremarkable. Skin: Warm and dry, persistent hyper and hypopigmented splotchiness on the back consistent with tinea versicolor. Cardiac: Regular rate and rhythm, no murmurs rubs or gallops.  Respiratory: Clear to auscultation bilaterally. Not using accessory muscles, speaking in full sentences.  Abdominal: Soft, nontender, nondistended, positive bowel sounds, no masses, no organomegaly.  Musculoskeletal: Shoulder, elbow, wrist, hip, knee, ankle stable, and  with full range of motion.  Impression and Recommendations:    The patient was counselled, risk factors were discussed, anticipatory guidance given.  Annual physical exam Unremarkable physical, checking blood work.  Tinea versicolor Improving after high-dose Diflucan, still present though.

## 2017-01-31 NOTE — Assessment & Plan Note (Signed)
Unremarkable physical, checking blood work.

## 2017-01-31 NOTE — Assessment & Plan Note (Signed)
Improving after high-dose Diflucan, still present though.

## 2017-02-01 LAB — PSA, TOTAL AND FREE
PSA, % Free: 27 % (ref >25)
PSA, Free: 0.4 ng/mL
PSA, Total: 1.5 ng/mL (ref <=4.0)

## 2017-02-01 LAB — VITAMIN D 25 HYDROXY (VIT D DEFICIENCY, FRACTURES): Vit D, 25-Hydroxy: 23 ng/mL — ABNORMAL LOW (ref 30–100)

## 2017-02-01 LAB — HIV ANTIBODY (ROUTINE TESTING W REFLEX): HIV 1&2 Ab, 4th Generation: NONREACTIVE

## 2017-02-02 MED ORDER — VITAMIN D (ERGOCALCIFEROL) 1.25 MG (50000 UNIT) PO CAPS: 50000.0000 [IU] | ORAL_CAPSULE | ORAL | 0 refills | Status: DC

## 2017-02-02 NOTE — Addendum Note (Signed)
Addended by: Monica BectonHEKKEKANDAM, THOMAS J on: 02/02/2017 09:36 AM   Modules accepted: Orders

## 2018-02-03 ENCOUNTER — Encounter: Payer: Self-pay | Admitting: Sports Medicine

## 2018-02-03 ENCOUNTER — Ambulatory Visit (INDEPENDENT_AMBULATORY_CARE_PROVIDER_SITE_OTHER): Payer: Managed Care, Other (non HMO) | Admitting: Sports Medicine

## 2018-02-03 VITALS — BP 123/79 | HR 54 | Wt 271.0 lb

## 2018-02-03 DIAGNOSIS — Z0000 Encounter for general adult medical examination without abnormal findings: Secondary | ICD-10-CM | POA: Diagnosis not present

## 2018-02-03 DIAGNOSIS — R635 Abnormal weight gain: Secondary | ICD-10-CM

## 2018-02-03 DIAGNOSIS — Z23 Encounter for immunization: Secondary | ICD-10-CM

## 2018-02-03 DIAGNOSIS — R0683 Snoring: Secondary | ICD-10-CM | POA: Diagnosis not present

## 2018-02-03 MED ORDER — PHENTERMINE HCL 37.5 MG PO TABS: ORAL_TABLET | ORAL | 0 refills | Status: DC

## 2018-02-03 NOTE — Assessment & Plan Note (Signed)
Discussed exercise prescription, diet and medication. We are going to proceed with phentermine, return monthly for weight checks and refills.

## 2018-02-03 NOTE — Assessment & Plan Note (Signed)
No issues with blood pressure, does wake feeling well rested. We will hold off on pulling the trigger for a sleep study until after some weight loss.

## 2018-02-03 NOTE — Progress Notes (Signed)
  Subjective:    CC: Annual physical  HPI:  This is a pleasant 42 year old male, he is here for his physical, no complaints, he would like some help losing weight.  I reviewed the past medical history, family history, social history, surgical history, and allergies today and no changes were needed.  Please see the problem list section below in epic for further details.  Past Medical History: No past medical history on file. Past Surgical History: No past surgical history on file. Social History: Social History   Socioeconomic History  . Marital status: Married    Spouse name: Not on file  . Number of children: Not on file  . Years of education: Not on file  . Highest education level: Not on file  Social Needs  . Financial resource strain: Not on file  . Food insecurity - worry: Not on file  . Food insecurity - inability: Not on file  . Transportation needs - medical: Not on file  . Transportation needs - non-medical: Not on file  Occupational History  . Not on file  Tobacco Use  . Smoking status: Never Smoker  Substance and Sexual Activity  . Alcohol use: Not on file  . Drug use: Not on file  . Sexual activity: Not on file  Other Topics Concern  . Not on file  Social History Narrative  . Not on file   Family History: No family history on file. Allergies: No Known Allergies Medications: See med rec.  Review of Systems: No headache, visual changes, nausea, vomiting, diarrhea, constipation, dizziness, abdominal pain, skin rash, fevers, chills, night sweats, swollen lymph nodes, weight loss, chest pain, body aches, joint swelling, muscle aches, shortness of breath, mood changes, visual or auditory hallucinations.  Objective:    General: Well Developed, well nourished, and in no acute distress.  Neuro: Alert and oriented x3, extra-ocular muscles intact, sensation grossly intact. Cranial nerves II through XII are intact, motor, sensory, and coordinative functions are all  intact. HEENT: Normocephalic, atraumatic, pupils equal round reactive to light, neck supple, no masses, no lymphadenopathy, thyroid nonpalpable. Oropharynx, nasopharynx, external ear canals are unremarkable. Skin: Warm and dry, no rashes noted.  Cardiac: Regular rate and rhythm, no murmurs rubs or gallops.  Respiratory: Clear to auscultation bilaterally. Not using accessory muscles, speaking in full sentences.  Abdominal: Soft, nontender, nondistended, positive bowel sounds, no masses, no organomegaly.  Musculoskeletal: Shoulder, elbow, wrist, hip, knee, ankle stable, and with full range of motion.  Impression and Recommendations:    The patient was counselled, risk factors were discussed, anticipatory guidance given.  Annual physical exam Annual physical as above, routine blood work ordered. Return in 1 year for repeat physical.  Snoring No issues with blood pressure, does wake feeling well rested. We will hold off on pulling the trigger for a sleep study until after some weight loss.  Abnormal weight gain Discussed exercise prescription, diet and medication. We are going to proceed with phentermine, return monthly for weight checks and refills.  ___________________________________________ [name], M.D., ABFM., CAQSM. Primary Care and Sports Medicine [facility]  Adjunct Instructor of Family Medicine  [employer]

## 2018-02-03 NOTE — Assessment & Plan Note (Addendum)
Annual physical as above, routine blood work ordered. Return in 1 year for repeat physical. Flu shot today.

## 2018-02-03 NOTE — Patient Instructions (Signed)
Diet for Metabolic Syndrome Metabolic syndrome is a disorder that includes at least three of these conditions:  Abdominal obesity.  Too much sugar in your blood.  High blood pressure.  Higher than normal amount of fat (lipids) in your blood.  Lower than normal level of "good" cholesterol (HDL).  Following a healthy diet can help to keep metabolic syndrome under control. It can also help to prevent the development of conditions that are associated with metabolic syndrome, such as diabetes, heart disease, and stroke. Along with exercise, a healthy diet:  Helps to improve the way that the body uses insulin.  Promotes weight loss. A common goal for people with this condition is to lose at least 7 to 10 percent of their starting weight.  What do I need to know about this diet?  Use the glycemic index (GI) to plan your meals. The index tells you how quickly a food will raise your blood sugar. Choose foods that have low GI values. These foods take a longer time to raise blood sugar.  Keep track of how many calories you take in. Eating the right amount of calories will help your achieve a healthy weight.  You may want to follow a Mediterranean diet. This diet includes lots of vegetables, lean meats or fish, whole grains, fruits, and healthy oils and fats. What foods can I eat? Grains Stone-ground whole wheat. Pumpernickel bread. Whole-grain bread, crackers, tortillas, cereal, and pasta. Unsweetened oatmeal.Bulgur.Barley.Quinoa.Brown rice or wild rice. Vegetables Lettuce. Spinach. Peas. Beets. Cauliflower. Cabbage. Broccoli. Carrots. Tomatoes. Squash. Eggplant. Herbs. Peppers. Onions. Cucumbers. Brussels sprouts. Sweet potatoes. Yams. Beans. Lentils. Fruits Berries. Apples. Oranges. Grapes. Mango. Pomegranate. Kiwi. Cherries. Meats and Other Protein Sources Seafood and shellfish. Lean meats.Poultry. Tofu. Dairy Low-fat or fat-free dairy products, such as milk, yogurt, and  cheese. Beverages Water. Low-fat milk. Milk alternatives, like soy milk or almond milk. Real fruit juice. Condiments Low-sugar or sugar-free ketchup, barbecue sauce, and mayonnaise. Mustard. Relish. Fats and Oils Avocado. Canola or olive oil. Nuts and nut butters.Seeds. The items listed above may not be a complete list of recommended foods or beverages. Contact your dietitian for more options. What foods are not recommended? Red meat. Palm oil and coconut oil. Processed foods. Fried foods. Alcohol. Sweetened drinks, such as iced tea and soda. Sweets. Salty foods. The items listed above may not be a complete list of foods and beverages to avoid. Contact your dietitian for more information. This information is not intended to replace advice given to you by your health care provider. Make sure you discuss any questions you have with your health care provider. Document Released: 04/01/2015 Document Revised: 03/26/2016 Document Reviewed: 11/27/2014 Elsevier Interactive Patient Education  2018 Elsevier Inc.     

## 2018-02-04 LAB — TSH: TSH: 0.8 m[IU]/L (ref 0.40–4.50)

## 2018-02-04 LAB — CBC
HCT: 42.2 % (ref 38.5–50.0)
Hemoglobin: 14.2 g/dL (ref 13.2–17.1)
MCH: 28.2 pg (ref 27.0–33.0)
MCHC: 33.6 g/dL (ref 32.0–36.0)
MCV: 83.9 fL (ref 80.0–100.0)
MPV: 12.7 fL — ABNORMAL HIGH (ref 7.5–12.5)
Platelets: 152 Thousand/uL (ref 140–400)
RBC: 5.03 10*6/uL (ref 4.20–5.80)
RDW: 12.6 % (ref 11.0–15.0)
WBC: 4.7 Thousand/uL (ref 3.8–10.8)

## 2018-02-04 LAB — COMPREHENSIVE METABOLIC PANEL
AG Ratio: 1.5 (calc) (ref 1.0–2.5)
AST: 16 U/L (ref 10–40)
Alkaline phosphatase (APISO): 58 U/L (ref 40–115)
CO2: 28 mmol/L (ref 20–32)
Chloride: 105 mmol/L (ref 98–110)
Globulin: 2.8 g/dL (calc) (ref 1.9–3.7)
Potassium: 4.2 mmol/L (ref 3.5–5.3)
Sodium: 140 mmol/L (ref 135–146)
Total Bilirubin: 0.5 mg/dL (ref 0.2–1.2)
Total Protein: 7 g/dL (ref 6.1–8.1)

## 2018-02-04 LAB — COMPREHENSIVE METABOLIC PANEL WITH GFR
ALT: 20 U/L (ref 9–46)
Albumin: 4.2 g/dL (ref 3.6–5.1)
BUN: 15 mg/dL (ref 7–25)
Calcium: 9.4 mg/dL (ref 8.6–10.3)
Creat: 1.34 mg/dL (ref 0.60–1.35)
Glucose, Bld: 80 mg/dL (ref 65–99)

## 2018-02-04 LAB — LIPID PANEL W/REFLEX DIRECT LDL
Cholesterol: 194 mg/dL (ref <200)
HDL: 42 mg/dL (ref >40)
LDL Cholesterol (Calc): 137 mg/dL (calc) — ABNORMAL HIGH
Non-HDL Cholesterol (Calc): 152 mg/dL — ABNORMAL HIGH (ref <130)
Total CHOL/HDL Ratio: 4.6 (calc) (ref <5.0)
Triglycerides: 55 mg/dL (ref <150)

## 2018-02-04 LAB — HEMOGLOBIN A1C
Hgb A1c MFr Bld: 5.5 %{Hb} (ref <5.7)
Mean Plasma Glucose: 111 (calc)
eAG (mmol/L): 6.2 (calc)

## 2018-02-04 LAB — VITAMIN D 25 HYDROXY (VIT D DEFICIENCY, FRACTURES): Vit D, 25-Hydroxy: 27 ng/mL — ABNORMAL LOW (ref 30–100)

## 2018-02-05 ENCOUNTER — Other Ambulatory Visit: Payer: Self-pay | Admitting: Sports Medicine

## 2018-02-05 MED ORDER — VITAMIN D (ERGOCALCIFEROL) 1.25 MG (50000 UNIT) PO CAPS: 50000.0000 [IU] | ORAL_CAPSULE | ORAL | 0 refills | Status: DC

## 2018-02-06 ENCOUNTER — Telehealth: Payer: Self-pay | Admitting: Sports Medicine

## 2018-02-06 NOTE — Telephone Encounter (Signed)
Received fax from [employer] that Phentermine was approved from 02/02/2018 until 08/09/2018. Pharmacy notified and forms sent to scan.   Reference ID: [id number].

## 2018-02-06 NOTE — Telephone Encounter (Signed)
Received fax from [employer] that Phentermine requires a PA. Information has been sent to the insurance company. Awaiting determination.

## 2018-03-03 ENCOUNTER — Ambulatory Visit: Payer: Managed Care, Other (non HMO) | Admitting: Sports Medicine

## 2018-03-06 ENCOUNTER — Ambulatory Visit (INDEPENDENT_AMBULATORY_CARE_PROVIDER_SITE_OTHER): Payer: Managed Care, Other (non HMO) | Admitting: Sports Medicine

## 2018-03-06 DIAGNOSIS — R635 Abnormal weight gain: Secondary | ICD-10-CM | POA: Diagnosis not present

## 2018-03-06 MED ORDER — PHENTERMINE HCL 37.5 MG PO TABS: ORAL_TABLET | ORAL | 0 refills | Status: DC

## 2018-03-06 NOTE — Progress Notes (Signed)
  Subjective:    CC: Weight check  HPI: Kenshawn returns, he is a good weight loss after the first month on phentermine, no adverse effects, no concerns.  I reviewed the past medical history, family history, social history, surgical history, and allergies today and no changes were needed.  Please see the problem list section below in epic for further details.  Past Medical History: No past medical history on file. Past Surgical History: No past surgical history on file. Social History: Social History   Socioeconomic History  . Marital status: Married    Spouse name: Not on file  . Number of children: Not on file  . Years of education: Not on file  . Highest education level: Not on file  Occupational History  . Not on file  Social Needs  . Financial resource strain: Not on file  . Food insecurity:    Worry: Not on file    Inability: Not on file  . Transportation needs:    Medical: Not on file    Non-medical: Not on file  Tobacco Use  . Smoking status: Never Smoker  Substance and Sexual Activity  . Alcohol use: Not on file  . Drug use: Not on file  . Sexual activity: Not on file  Lifestyle  . Physical activity:    Days per week: Not on file    Minutes per session: Not on file  . Stress: Not on file  Relationships  . Social connections:    Talks on phone: Not on file    Gets together: Not on file    Attends religious service: Not on file    Active member of club or organization: Not on file    Attends meetings of clubs or organizations: Not on file    Relationship status: Not on file  Other Topics Concern  . Not on file  Social History Narrative  . Not on file   Family History: No family history on file. Allergies: No Known Allergies Medications: See med rec.  Review of Systems: No fevers, chills, night sweats, weight loss, chest pain, or shortness of breath.   Objective:    General: Well Developed, well nourished, and in no acute distress.  Neuro: Alert  and oriented x3, extra-ocular muscles intact, sensation grossly intact.  HEENT: Normocephalic, atraumatic, pupils equal round reactive to light, neck supple, no masses, no lymphadenopathy, thyroid nonpalpable.  Skin: Warm and dry, no rashes. Cardiac: Regular rate and rhythm, no murmurs rubs or gallops, no lower extremity edema.  Respiratory: Clear to auscultation bilaterally. Not using accessory muscles, speaking in full sentences.  Impression and Recommendations:    Abnormal weight gain Good weight loss after the first month, refilling medication, return in 1 month. ___________________________________________ [name], M.D., ABFM., CAQSM. Primary Care and Sports Medicine [facility]  Adjunct Instructor of Family Medicine  [employer]

## 2018-03-06 NOTE — Assessment & Plan Note (Signed)
Good weight loss after the first month, refilling medication, return in 1 month.

## 2018-03-28 DIAGNOSIS — E559 Vitamin D deficiency, unspecified: Secondary | ICD-10-CM | POA: Insufficient documentation

## 2018-04-07 ENCOUNTER — Ambulatory Visit (INDEPENDENT_AMBULATORY_CARE_PROVIDER_SITE_OTHER): Payer: Managed Care, Other (non HMO) | Admitting: Sports Medicine

## 2018-04-07 DIAGNOSIS — R635 Abnormal weight gain: Secondary | ICD-10-CM

## 2018-04-07 MED ORDER — PHENTERMINE HCL 37.5 MG PO TABS: ORAL_TABLET | ORAL | 0 refills | Status: DC

## 2018-04-07 MED ORDER — VITAMIN D (ERGOCALCIFEROL) 1.25 MG (50000 UNIT) PO CAPS: 50000.0000 [IU] | ORAL_CAPSULE | ORAL | 0 refills | Status: DC

## 2018-04-07 NOTE — Progress Notes (Signed)
  Subjective:    CC: Follow-up  HPI: Obesity: Entering the third month, only a 3 pound weight loss but he has been spending a great deal of time in the gym putting on some muscle and making some gains.  I reviewed the past medical history, family history, social history, surgical history, and allergies today and no changes were needed.  Please see the problem list section below in epic for further details.  Past Medical History: No past medical history on file. Past Surgical History: No past surgical history on file. Social History: Social History   Socioeconomic History  . Marital status: Married    Spouse name: Not on file  . Number of children: Not on file  . Years of education: Not on file  . Highest education level: Not on file  Occupational History  . Not on file  Social Needs  . Financial resource strain: Not on file  . Food insecurity:    Worry: Not on file    Inability: Not on file  . Transportation needs:    Medical: Not on file    Non-medical: Not on file  Tobacco Use  . Smoking status: Never Smoker  Substance and Sexual Activity  . Alcohol use: Not on file  . Drug use: Not on file  . Sexual activity: Not on file  Lifestyle  . Physical activity:    Days per week: Not on file    Minutes per session: Not on file  . Stress: Not on file  Relationships  . Social connections:    Talks on phone: Not on file    Gets together: Not on file    Attends religious service: Not on file    Active member of club or organization: Not on file    Attends meetings of clubs or organizations: Not on file    Relationship status: Not on file  Other Topics Concern  . Not on file  Social History Narrative  . Not on file   Family History: No family history on file. Allergies: No Known Allergies Medications: See med rec.  Review of Systems: No fevers, chills, night sweats, weight loss, chest pain, or shortness of breath.   Objective:    General: Well Developed, well  nourished, and in no acute distress.  Neuro: Alert and oriented x3, extra-ocular muscles intact, sensation grossly intact.  HEENT: Normocephalic, atraumatic, pupils equal round reactive to light, neck supple, no masses, no lymphadenopathy, thyroid nonpalpable.  Skin: Warm and dry, no rashes. Cardiac: Regular rate and rhythm, no murmurs rubs or gallops, no lower extremity edema.  Respiratory: Clear to auscultation bilaterally. Not using accessory muscles, speaking in full sentences.  Impression and Recommendations:    Abnormal weight gain 3 pound weight loss after the second month, refilling phentermine, entering the third month. ___________________________________________ Ihor Austin. Benjamin Stain, M.D., ABFM., CAQSM. Primary Care and Sports Medicine [facility]  Adjunct Instructor of Family Medicine  [employer]

## 2018-04-07 NOTE — Assessment & Plan Note (Signed)
3 pound weight loss after the second month, refilling phentermine, entering the third month.

## 2018-05-05 ENCOUNTER — Ambulatory Visit (INDEPENDENT_AMBULATORY_CARE_PROVIDER_SITE_OTHER): Payer: Managed Care, Other (non HMO) | Admitting: Sports Medicine

## 2018-05-05 ENCOUNTER — Encounter: Payer: Self-pay | Admitting: Sports Medicine

## 2018-05-05 DIAGNOSIS — R635 Abnormal weight gain: Secondary | ICD-10-CM | POA: Diagnosis not present

## 2018-05-05 MED ORDER — PHENTERMINE HCL 37.5 MG PO TABS: ORAL_TABLET | ORAL | 0 refills | Status: DC

## 2018-05-05 MED ORDER — TAMSULOSIN HCL 0.4 MG PO CAPS: 0.4000 mg | ORAL_CAPSULE | Freq: Every day | ORAL | 3 refills | Status: DC

## 2018-05-05 NOTE — Assessment & Plan Note (Signed)
Additional 10 pound weight loss, entering the fourth month.   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

## 2018-05-05 NOTE — Progress Notes (Signed)
  Subjective:    CC: Weight check  HPI: Keevon returns, he is lost 10 pounds after the third month.  He is having a bit of difficulty urinating, poor stream, dribbling, frequency.  Symptoms are moderate, persistent, no urgency, burning, fevers, chills, flank pain.  I reviewed the past medical history, family history, social history, surgical history, and allergies today and no changes were needed.  Please see the problem list section below in epic for further details.  Past Medical History: No past medical history on file. Past Surgical History: No past surgical history on file. Social History: Social History   Socioeconomic History  . Marital status: Married    Spouse name: Not on file  . Number of children: Not on file  . Years of education: Not on file  . Highest education level: Not on file  Occupational History  . Not on file  Social Needs  . Financial resource strain: Not on file  . Food insecurity:    Worry: Not on file    Inability: Not on file  . Transportation needs:    Medical: Not on file    Non-medical: Not on file  Tobacco Use  . Smoking status: Never Smoker  . Smokeless tobacco: Never Used  Substance and Sexual Activity  . Alcohol use: Not on file  . Drug use: Not on file  . Sexual activity: Not on file  Lifestyle  . Physical activity:    Days per week: Not on file    Minutes per session: Not on file  . Stress: Not on file  Relationships  . Social connections:    Talks on phone: Not on file    Gets together: Not on file    Attends religious service: Not on file    Active member of club or organization: Not on file    Attends meetings of clubs or organizations: Not on file    Relationship status: Not on file  Other Topics Concern  . Not on file  Social History Narrative  . Not on file   Family History: No family history on file. Allergies: No Known Allergies Medications: See med rec.  Review of Systems: No fevers, chills, night sweats,  weight loss, chest pain, or shortness of breath.   Objective:    General: Well Developed, well nourished, and in no acute distress.  Neuro: Alert and oriented x3, extra-ocular muscles intact, sensation grossly intact.  HEENT: Normocephalic, atraumatic, pupils equal round reactive to light, neck supple, no masses, no lymphadenopathy, thyroid nonpalpable.  Skin: Warm and dry, no rashes. Cardiac: Regular rate and rhythm, no murmurs rubs or gallops, no lower extremity edema.  Respiratory: Clear to auscultation bilaterally. Not using accessory muscles, speaking in full sentences.  Impression and Recommendations:    Abnormal weight gain Additional 10 pound weight loss, entering the fourth month.   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  I spent 25 minutes with this patient, greater than 50% was face-to-face time counseling regarding the above diagnoses, this time was spent discussing the anatomy and urodynamics of obstructive uropathy. ___________________________________________ [name], M.D., ABFM., CAQSM. Primary Care and Sports Medicine [facility]  Adjunct Instructor of Family Medicine  [employer]

## 2018-06-07 ENCOUNTER — Ambulatory Visit: Payer: Managed Care, Other (non HMO) | Admitting: Sports Medicine

## 2018-06-19 ENCOUNTER — Encounter: Payer: Self-pay | Admitting: Sports Medicine

## 2018-06-19 ENCOUNTER — Ambulatory Visit (INDEPENDENT_AMBULATORY_CARE_PROVIDER_SITE_OTHER): Payer: Managed Care, Other (non HMO) | Admitting: Sports Medicine

## 2018-06-19 DIAGNOSIS — R635 Abnormal weight gain: Secondary | ICD-10-CM

## 2018-06-19 MED ORDER — PHENTERMINE HCL 37.5 MG PO TABS: ORAL_TABLET | ORAL | 0 refills | Status: DC

## 2018-06-19 NOTE — Assessment & Plan Note (Signed)
2 pound weight loss after the last month, [place] go [place] in [place] recently. He will get back on track, 10 pound weight loss is expected over the next month, entering the fifth month.

## 2018-06-19 NOTE — Progress Notes (Signed)
  Subjective:    CC: Follow-up  HPI: Obesity: 2 pound weight loss, did have several days of vacation in [place].  Obstructive uropathy symptoms resolved with Flomax.  I reviewed the past medical history, family history, social history, surgical history, and allergies today and no changes were needed.  Please see the problem list section below in epic for further details.  Past Medical History: No past medical history on file. Past Surgical History: No past surgical history on file. Social History: Social History   Socioeconomic History  . Marital status: Married    Spouse name: Not on file  . Number of children: Not on file  . Years of education: Not on file  . Highest education level: Not on file  Occupational History  . Not on file  Social Needs  . Financial resource strain: Not on file  . Food insecurity:    Worry: Not on file    Inability: Not on file  . Transportation needs:    Medical: Not on file    Non-medical: Not on file  Tobacco Use  . Smoking status: Never Smoker  . Smokeless tobacco: Never Used  Substance and Sexual Activity  . Alcohol use: Not on file  . Drug use: Not on file  . Sexual activity: Not on file  Lifestyle  . Physical activity:    Days per week: Not on file    Minutes per session: Not on file  . Stress: Not on file  Relationships  . Social connections:    Talks on phone: Not on file    Gets together: Not on file    Attends religious service: Not on file    Active member of club or organization: Not on file    Attends meetings of clubs or organizations: Not on file    Relationship status: Not on file  Other Topics Concern  . Not on file  Social History Narrative  . Not on file   Family History: No family history on file. Allergies: No Known Allergies Medications: See med rec.  Review of Systems: No fevers, chills, night sweats, weight loss, chest pain, or shortness of breath.   Objective:    General: Well Developed, well  nourished, and in no acute distress.  Neuro: Alert and oriented x3, extra-ocular muscles intact, sensation grossly intact.  HEENT: Normocephalic, atraumatic, pupils equal round reactive to light, neck supple, no masses, no lymphadenopathy, thyroid nonpalpable.  Skin: Warm and dry, no rashes. Cardiac: Regular rate and rhythm, no murmurs rubs or gallops, no lower extremity edema.  Respiratory: Clear to auscultation bilaterally. Not using accessory muscles, speaking in full sentences.  Impression and Recommendations:    Abnormal weight gain 2 pound weight loss after the last month, [place]. He will get back on track, 10 pound weight loss is expected over the next month, entering the fifth month.  ___________________________________________ [name], M.D., ABFM., CAQSM. Primary Care and Sports Medicine [facility]  Adjunct Instructor of Family Medicine  [employer]

## 2018-07-17 ENCOUNTER — Ambulatory Visit: Payer: Managed Care, Other (non HMO) | Admitting: Sports Medicine

## 2018-07-26 ENCOUNTER — Encounter: Payer: Self-pay | Admitting: Sports Medicine

## 2018-07-26 ENCOUNTER — Ambulatory Visit (INDEPENDENT_AMBULATORY_CARE_PROVIDER_SITE_OTHER): Payer: Managed Care, Other (non HMO) | Admitting: Sports Medicine

## 2018-07-26 VITALS — BP 118/75 | HR 79 | Ht 76.0 in | Wt 244.0 lb

## 2018-07-26 DIAGNOSIS — Z23 Encounter for immunization: Secondary | ICD-10-CM | POA: Diagnosis not present

## 2018-07-26 DIAGNOSIS — R635 Abnormal weight gain: Secondary | ICD-10-CM

## 2018-07-26 MED ORDER — PHENTERMINE HCL 37.5 MG PO TABS: ORAL_TABLET | ORAL | 0 refills | Status: DC

## 2018-07-26 NOTE — Assessment & Plan Note (Signed)
4 pound additional weight loss, entering the sixth month. Goal is in the 230s. If he plateaus over the last month we will add Topamax as well.

## 2018-07-26 NOTE — Progress Notes (Signed)
  Subjective:    CC: Follow-up  HPI: Obesity: Lost an additional 4 pounds over the past month.  He is nearing his goal of 235 pounds.  I reviewed the past medical history, family history, social history, surgical history, and allergies today and no changes were needed.  Please see the problem list section below in epic for further details.  Past Medical History: No past medical history on file. Past Surgical History: No past surgical history on file. Social History: Social History   Socioeconomic History  . Marital status: Married    Spouse name: Not on file  . Number of children: Not on file  . Years of education: Not on file  . Highest education level: Not on file  Occupational History  . Not on file  Social Needs  . Financial resource strain: Not on file  . Food insecurity:    Worry: Not on file    Inability: Not on file  . Transportation needs:    Medical: Not on file    Non-medical: Not on file  Tobacco Use  . Smoking status: Never Smoker  . Smokeless tobacco: Never Used  Substance and Sexual Activity  . Alcohol use: Not on file  . Drug use: Not on file  . Sexual activity: Not on file  Lifestyle  . Physical activity:    Days per week: Not on file    Minutes per session: Not on file  . Stress: Not on file  Relationships  . Social connections:    Talks on phone: Not on file    Gets together: Not on file    Attends religious service: Not on file    Active member of club or organization: Not on file    Attends meetings of clubs or organizations: Not on file    Relationship status: Not on file  Other Topics Concern  . Not on file  Social History Narrative  . Not on file   Family History: No family history on file. Allergies: No Known Allergies Medications: See med rec.  Review of Systems: No fevers, chills, night sweats, weight loss, chest pain, or shortness of breath.   Objective:    General: Well Developed, well nourished, and in no acute distress.   Neuro: Alert and oriented x3, extra-ocular muscles intact, sensation grossly intact.  HEENT: Normocephalic, atraumatic, pupils equal round reactive to light, neck supple, no masses, no lymphadenopathy, thyroid nonpalpable.  Skin: Warm and dry, no rashes. Cardiac: Regular rate and rhythm, no murmurs rubs or gallops, no lower extremity edema.  Respiratory: Clear to auscultation bilaterally. Not using accessory muscles, speaking in full sentences.  Impression and Recommendations:    Abnormal weight gain 4 pound additional weight loss, entering the sixth month. Goal is in the 230s. If he plateaus over the last month we will add Topamax as well.  ___________________________________________ [name], M.D., ABFM., CAQSM. Primary Care and Sports Medicine [facility]  Adjunct Instructor of Family Medicine  [employer]

## 2018-08-25 ENCOUNTER — Ambulatory Visit: Payer: Managed Care, Other (non HMO) | Admitting: Sports Medicine

## 2018-08-31 ENCOUNTER — Ambulatory Visit (INDEPENDENT_AMBULATORY_CARE_PROVIDER_SITE_OTHER): Payer: Managed Care, Other (non HMO) | Admitting: Sports Medicine

## 2018-08-31 DIAGNOSIS — R635 Abnormal weight gain: Secondary | ICD-10-CM

## 2018-08-31 MED ORDER — TOPIRAMATE 50 MG PO TABS: ORAL_TABLET | ORAL | 3 refills | Status: DC

## 2018-08-31 MED ORDER — PHENTERMINE HCL 37.5 MG PO TABS: 18.7500 mg | ORAL_TABLET | Freq: Every day | ORAL | 0 refills | Status: DC

## 2018-08-31 NOTE — Assessment & Plan Note (Signed)
Did plateau after 6 months. Dropping to a half dose phentermine and adding Topamax in an up taper.

## 2018-08-31 NOTE — Progress Notes (Signed)
  Subjective:    CC: Follow-up  HPI: Obesity: Finally plateaued after 6 months of phentermine.  I reviewed the past medical history, family history, social history, surgical history, and allergies today and no changes were needed.  Please see the problem list section below in epic for further details.  Past Medical History: No past medical history on file. Past Surgical History: No past surgical history on file. Social History: Social History   Socioeconomic History  . Marital status: Married    Spouse name: Not on file  . Number of children: Not on file  . Years of education: Not on file  . Highest education level: Not on file  Occupational History  . Not on file  Social Needs  . Financial resource strain: Not on file  . Food insecurity:    Worry: Not on file    Inability: Not on file  . Transportation needs:    Medical: Not on file    Non-medical: Not on file  Tobacco Use  . Smoking status: Never Smoker  . Smokeless tobacco: Never Used  Substance and Sexual Activity  . Alcohol use: Not on file  . Drug use: Not on file  . Sexual activity: Not on file  Lifestyle  . Physical activity:    Days per week: Not on file    Minutes per session: Not on file  . Stress: Not on file  Relationships  . Social connections:    Talks on phone: Not on file    Gets together: Not on file    Attends religious service: Not on file    Active member of club or organization: Not on file    Attends meetings of clubs or organizations: Not on file    Relationship status: Not on file  Other Topics Concern  . Not on file  Social History Narrative  . Not on file   Family History: No family history on file. Allergies: No Known Allergies Medications: See med rec.  Review of Systems: No fevers, chills, night sweats, weight loss, chest pain, or shortness of breath.   Objective:    General: Well Developed, well nourished, and in no acute distress.  Neuro: Alert and oriented x3,  extra-ocular muscles intact, sensation grossly intact.  HEENT: Normocephalic, atraumatic, pupils equal round reactive to light, neck supple, no masses, no lymphadenopathy, thyroid nonpalpable.  Skin: Warm and dry, no rashes. Cardiac: Regular rate and rhythm, no murmurs rubs or gallops, no lower extremity edema.  Respiratory: Clear to auscultation bilaterally. Not using accessory muscles, speaking in full sentences.  Impression and Recommendations:    Abnormal weight gain Did plateau after 6 months. Dropping to a half dose phentermine and adding Topamax in an up taper. ___________________________________________ Ihor Austin. Benjamin Stain, M.D., ABFM., CAQSM. Primary Care and Sports Medicine [facility]  Adjunct Instructor of Family Medicine  [employer]

## 2018-10-13 ENCOUNTER — Other Ambulatory Visit: Payer: Self-pay | Admitting: Sports Medicine

## 2018-10-13 DIAGNOSIS — R635 Abnormal weight gain: Secondary | ICD-10-CM

## 2018-12-01 ENCOUNTER — Encounter: Payer: Self-pay | Admitting: Sports Medicine

## 2018-12-01 ENCOUNTER — Ambulatory Visit (INDEPENDENT_AMBULATORY_CARE_PROVIDER_SITE_OTHER): Payer: Managed Care, Other (non HMO) | Admitting: Sports Medicine

## 2018-12-01 ENCOUNTER — Ambulatory Visit (INDEPENDENT_AMBULATORY_CARE_PROVIDER_SITE_OTHER): Payer: Managed Care, Other (non HMO)

## 2018-12-01 DIAGNOSIS — M5030 Other cervical disc degeneration, unspecified cervical region: Secondary | ICD-10-CM

## 2018-12-01 DIAGNOSIS — M47812 Spondylosis without myelopathy or radiculopathy, cervical region: Secondary | ICD-10-CM | POA: Diagnosis not present

## 2018-12-01 DIAGNOSIS — R635 Abnormal weight gain: Secondary | ICD-10-CM | POA: Diagnosis not present

## 2018-12-01 MED ORDER — PHENTERMINE HCL 37.5 MG PO TABS: 18.7500 mg | ORAL_TABLET | Freq: Every day | ORAL | 0 refills | Status: DC

## 2018-12-01 NOTE — Assessment & Plan Note (Signed)
Mild, right-sided without radicular symptoms. X-rays, continue naproxen as needed, rehab exercises given. Return to see me in a month if needed.

## 2018-12-01 NOTE — Progress Notes (Signed)
Subjective:    CC: Follow-up  HPI: Abnormal weight gain: Stable, we have now finished 6 months of full dose phentermine in 3 months of half dose.  He is only doing his Topamax one half tab daily.  Neck pain: Localized on the left side, worse with prolonged downgaze, moderate, persistent without radiation, nothing radicular.  I reviewed the past medical history, family history, social history, surgical history, and allergies today and no changes were needed.  Please see the problem list section below in epic for further details.  Past Medical History: No past medical history on file. Past Surgical History: No past surgical history on file. Social History: Social History   Socioeconomic History  . Marital status: Married    Spouse name: Not on file  . Number of children: Not on file  . Years of education: Not on file  . Highest education level: Not on file  Occupational History  . Not on file  Social Needs  . Financial resource strain: Not on file  . Food insecurity:    Worry: Not on file    Inability: Not on file  . Transportation needs:    Medical: Not on file    Non-medical: Not on file  Tobacco Use  . Smoking status: Never Smoker  . Smokeless tobacco: Never Used  Substance and Sexual Activity  . Alcohol use: Not on file  . Drug use: Not on file  . Sexual activity: Not on file  Lifestyle  . Physical activity:    Days per week: Not on file    Minutes per session: Not on file  . Stress: Not on file  Relationships  . Social connections:    Talks on phone: Not on file    Gets together: Not on file    Attends religious service: Not on file    Active member of club or organization: Not on file    Attends meetings of clubs or organizations: Not on file    Relationship status: Not on file  Other Topics Concern  . Not on file  Social History Narrative  . Not on file   Family History: No family history on file. Allergies: No Known Allergies Medications: See med  rec.  Review of Systems: No fevers, chills, night sweats, weight loss, chest pain, or shortness of breath.   Objective:    General: Well Developed, well nourished, and in no acute distress.  Neuro: Alert and oriented x3, extra-ocular muscles intact, sensation grossly intact.  HEENT: Normocephalic, atraumatic, pupils equal round reactive to light, neck supple, no masses, no lymphadenopathy, thyroid nonpalpable.  Skin: Warm and dry, no rashes. Cardiac: Regular rate and rhythm, no murmurs rubs or gallops, no lower extremity edema.  Respiratory: Clear to auscultation bilaterally. Not using accessory muscles, speaking in full sentences. Neck: Negative spurling's Full neck range of motion Grip strength and sensation normal in bilateral hands Strength good C4 to T1 distribution No sensory change to C4 to T1 Reflexes normal  Impression and Recommendations:    Abnormal weight gain Weight has plateaued. He did tell me he is not taking his Topamax as prescribed, he is only doing 1/2 pill once daily. Final refill of phentermine, we will do 3 months at the half dose and then stop. He is going to go ahead and bump his Topamax up to a full 50 mg twice a day, only stop his phentermine we may need to increase Topamax to 100 twice daily to maintain his weight loss.   He  is happy where he is now but would be very happy with 5 more pounds.  DDD (degenerative disc disease), cervical Mild, right-sided without radicular symptoms. X-rays, continue naproxen as needed, rehab exercises given. Return to see me in a month if needed. ___________________________________________ [name], M.D., ABFM., CAQSM. Primary Care and Sports Medicine [facility]  Adjunct Professor of Family Medicine  [employer]

## 2018-12-01 NOTE — Assessment & Plan Note (Signed)
Weight has plateaued. He did tell me he is not taking his Topamax as prescribed, he is only doing 1/2 pill once daily. Final refill of phentermine, we will do 3 months at the half dose and then stop. He is going to go ahead and bump his Topamax up to a full 50 mg twice a day, only stop his phentermine we may need to increase Topamax to 100 twice daily to maintain his weight loss.   He is happy where he is now but would be very happy with 5 more pounds.

## 2019-03-02 ENCOUNTER — Ambulatory Visit: Payer: Managed Care, Other (non HMO) | Admitting: Sports Medicine

## 2019-03-30 ENCOUNTER — Ambulatory Visit: Payer: Managed Care, Other (non HMO) | Admitting: Sports Medicine

## 2019-08-03 ENCOUNTER — Other Ambulatory Visit: Payer: Self-pay

## 2019-08-03 ENCOUNTER — Ambulatory Visit (INDEPENDENT_AMBULATORY_CARE_PROVIDER_SITE_OTHER): Payer: Managed Care, Other (non HMO) | Admitting: Sports Medicine

## 2019-08-03 DIAGNOSIS — Z8042 Family history of malignant neoplasm of prostate: Secondary | ICD-10-CM

## 2019-08-03 DIAGNOSIS — Z0000 Encounter for general adult medical examination without abnormal findings: Secondary | ICD-10-CM | POA: Diagnosis not present

## 2019-08-03 NOTE — Progress Notes (Signed)
Virtual Visit via WebEx/MyChart   I connected with  Brandon Wood  on 08/03/19 via WebEx/MyChart/Doximity Video and verified that I am speaking with the correct person using two identifiers.   I discussed the limitations, risks, security and privacy concerns of performing an evaluation and management service by WebEx/MyChart/Doximity Video, including the higher likelihood of inaccurate diagnosis and treatment, and the availability of in person appointments.  We also discussed the likely need of an additional face to face encounter for complete and high quality delivery of care.  I also discussed with the patient that there may be a patient responsible charge related to this service. The patient expressed understanding and wishes to proceed.  Provider location is either at home or medical facility. Patient location is at their home, different from provider location. People involved in care of the patient during this telehealth encounter were myself, my nurse/medical assistant, and my front office/scheduling team member.  Subjective:    CC: Preventive visit  HPI: Brandon Wood would like to catch up on his preventive measures, he does have a family history of prostate cancer and would like a PSA, he would actually like to come in for a DRE at some point.  I reviewed the past medical history, family history, social history, surgical history, and allergies today and no changes were needed.  Please see the problem list section below in epic for further details.  Past Medical History: No past medical history on file. Past Surgical History: No past surgical history on file. Social History: Social History   Socioeconomic History  . Marital status: Married    Spouse name: Not on file  . Number of children: Not on file  . Years of education: Not on file  . Highest education level: Not on file  Occupational History  . Not on file  Social Needs  . Financial resource strain: Not on file  . Food  insecurity    Worry: Not on file    Inability: Not on file  . Transportation needs    Medical: Not on file    Non-medical: Not on file  Tobacco Use  . Smoking status: Never Smoker  . Smokeless tobacco: Never Used  Substance and Sexual Activity  . Alcohol use: Not on file  . Drug use: Not on file  . Sexual activity: Not on file  Lifestyle  . Physical activity    Days per week: Not on file    Minutes per session: Not on file  . Stress: Not on file  Relationships  . Social Herbalist on phone: Not on file    Gets together: Not on file    Attends religious service: Not on file    Active member of club or organization: Not on file    Attends meetings of clubs or organizations: Not on file    Relationship status: Not on file  Other Topics Concern  . Not on file  Social History Narrative  . Not on file   Family History: No family history on file. Allergies: No Known Allergies Medications: See med rec.  Review of Systems: No fevers, chills, night sweats, weight loss, chest pain, or shortness of breath.   Objective:    General: Speaking full sentences, no audible heavy breathing.  Sounds alert and appropriately interactive.  Appears well.  Face symmetric.  Extraocular movements intact.  Pupils equal and round.  No nasal flaring or accessory muscle use visualized.  No other physical exam performed due to  the non-physical nature of this visit.  Impression and Recommendations:    Family history of prostate cancer in father Checking PSA, he does desire to have a DRE at some point in the future.  Annual physical exam Preventive visit conducted virtually. Adding routine labs. He is due for a flu shot.  I discussed the above assessment and treatment plan with the patient. The patient was provided an opportunity to ask questions and all were answered. The patient agreed with the plan and demonstrated an understanding of the instructions.   The patient was advised to  call back or seek an in-person evaluation if the symptoms worsen or if the condition fails to improve as anticipated.   I provided 25 minutes of non-face-to-face time during this encounter, 15 minutes of additional time was needed to gather information, review chart, records, communicate/coordinate with staff remotely, troubleshooting the multiple errors that we get every time when trying to do video calls through the electronic medical record, WebEx, and Doximity, restart the encounter multiple times due to instability of the software, as well as complete documentation.   ___________________________________________ [name], M.D., ABFM., CAQSM. Primary Care and Sports Medicine [facility]  Adjunct Professor of Family Medicine  [employer]

## 2019-08-03 NOTE — Assessment & Plan Note (Signed)
Preventive visit conducted virtually. Adding routine labs. He is due for a flu shot.

## 2019-08-03 NOTE — Assessment & Plan Note (Signed)
Checking PSA, he does desire to have a DRE at some point in the future.

## 2019-08-04 ENCOUNTER — Encounter: Payer: Self-pay | Admitting: Osteopathic Medicine

## 2019-08-04 ENCOUNTER — Other Ambulatory Visit: Payer: Self-pay | Admitting: Osteopathic Medicine

## 2019-08-04 MED ORDER — LORATADINE 10 MG PO TABS: 10.0000 mg | ORAL_TABLET | Freq: Every day | ORAL | 0 refills | Status: DC | PRN

## 2019-08-04 NOTE — Progress Notes (Signed)
See Haiku note from 08/04/19

## 2019-08-04 NOTE — Progress Notes (Unsigned)
Got called from on call nurse re: PT reported some antihistamines were supposed to be called in after his physical yesterday. He was having some sore throat and runny nose. I briefly reviewed notes. I don't see any mention of him being ill. Advised can take Claritin 10 mg daily or bid for one week. Will try to send something in but nurse advised on above instructions in case I am unable to order Rx remotely.

## 2019-08-14 LAB — COMPLETE METABOLIC PANEL WITH GFR
AG Ratio: 1.6 (calc) (ref 1.0–2.5)
ALT: 24 U/L (ref 9–46)
AST: 18 U/L (ref 10–40)
Albumin: 3.9 g/dL (ref 3.6–5.1)
Alkaline phosphatase (APISO): 50 U/L (ref 36–130)
BUN/Creatinine Ratio: 8 (calc) (ref 6–22)
BUN: 11 mg/dL (ref 7–25)
CO2: 29 mmol/L (ref 20–32)
Calcium: 9.3 mg/dL (ref 8.6–10.3)
Chloride: 105 mmol/L (ref 98–110)
Creat: 1.44 mg/dL — ABNORMAL HIGH (ref 0.60–1.35)
GFR, Est African American: 68 mL/min/{1.73_m2} (ref >=60)
GFR, Est Non African American: 59 mL/min/{1.73_m2} — ABNORMAL LOW (ref >=60)
Globulin: 2.5 g/dL (calc) (ref 1.9–3.7)
Glucose, Bld: 96 mg/dL (ref 65–99)
Potassium: 4.4 mmol/L (ref 3.5–5.3)
Sodium: 140 mmol/L (ref 135–146)
Total Bilirubin: 0.4 mg/dL (ref 0.2–1.2)
Total Protein: 6.4 g/dL (ref 6.1–8.1)

## 2019-08-14 LAB — LIPID PANEL W/REFLEX DIRECT LDL
Cholesterol: 166 mg/dL (ref <200)
HDL: 47 mg/dL (ref >=40)
LDL Cholesterol (Calc): 105 mg/dL (calc) — ABNORMAL HIGH
Non-HDL Cholesterol (Calc): 119 mg/dL (calc) (ref <130)
Total CHOL/HDL Ratio: 3.5 (calc) (ref <5.0)
Triglycerides: 55 mg/dL (ref <150)

## 2019-08-14 LAB — PSA, TOTAL AND FREE
PSA, % Free: 20 % (calc) — ABNORMAL LOW (ref >25)
PSA, Free: 0.4 ng/mL
PSA, Total: 2 ng/mL (ref <=4.0)

## 2019-08-14 LAB — TSH: TSH: 0.76 mIU/L (ref 0.40–4.50)

## 2019-08-14 LAB — CBC
HCT: 43.3 % (ref 38.5–50.0)
Hemoglobin: 14.2 g/dL (ref 13.2–17.1)
MCH: 28.1 pg (ref 27.0–33.0)
MCHC: 32.8 g/dL (ref 32.0–36.0)
MCV: 85.6 fL (ref 80.0–100.0)
MPV: 12.7 fL — ABNORMAL HIGH (ref 7.5–12.5)
Platelets: 161 10*3/uL (ref 140–400)
RBC: 5.06 10*6/uL (ref 4.20–5.80)
RDW: 13 % (ref 11.0–15.0)
WBC: 3.3 10*3/uL — ABNORMAL LOW (ref 3.8–10.8)

## 2019-08-14 LAB — VITAMIN D 25 HYDROXY (VIT D DEFICIENCY, FRACTURES): Vit D, 25-Hydroxy: 27 ng/mL — ABNORMAL LOW (ref 30–100)

## 2019-08-14 LAB — HEMOGLOBIN A1C
Hgb A1c MFr Bld: 5.4 % of total Hgb (ref <5.7)
Mean Plasma Glucose: 108 (calc)
eAG (mmol/L): 6 (calc)

## 2019-08-14 MED ORDER — VITAMIN D (ERGOCALCIFEROL) 1.25 MG (50000 UNIT) PO CAPS: 50000.0000 [IU] | ORAL_CAPSULE | ORAL | 0 refills | Status: DC

## 2019-08-14 NOTE — Addendum Note (Signed)
Addended by: Silverio Decamp on: 08/14/2019 09:06 AM   Modules accepted: Orders

## 2019-11-26 ENCOUNTER — Other Ambulatory Visit: Payer: Self-pay | Admitting: Sports Medicine

## 2019-11-26 DIAGNOSIS — R635 Abnormal weight gain: Secondary | ICD-10-CM

## 2020-09-30 ENCOUNTER — Ambulatory Visit (INDEPENDENT_AMBULATORY_CARE_PROVIDER_SITE_OTHER): Payer: Managed Care, Other (non HMO) | Admitting: Sports Medicine

## 2020-09-30 ENCOUNTER — Encounter: Payer: Self-pay | Admitting: Sports Medicine

## 2020-09-30 ENCOUNTER — Other Ambulatory Visit: Payer: Self-pay

## 2020-09-30 VITALS — BP 105/67 | HR 58 | Ht 76.0 in | Wt 265.0 lb

## 2020-09-30 DIAGNOSIS — Z8042 Family history of malignant neoplasm of prostate: Secondary | ICD-10-CM | POA: Diagnosis not present

## 2020-09-30 DIAGNOSIS — Z23 Encounter for immunization: Secondary | ICD-10-CM | POA: Diagnosis not present

## 2020-09-30 DIAGNOSIS — Z0000 Encounter for general adult medical examination without abnormal findings: Secondary | ICD-10-CM

## 2020-09-30 NOTE — Assessment & Plan Note (Signed)
PSA was checked last year, I would like to check it every 2 years. Father was diagnosed in his 7s with prostate cancer, surgically cured.

## 2020-09-30 NOTE — Progress Notes (Signed)
Subjective:    CC: Annual Physical Exam  HPI:  This patient is here for their annual physical  I reviewed the past medical history, family history, social history, surgical history, and allergies today and no changes were needed.  Please see the problem list section below in epic for further details.  Past Medical History: No past medical history on file. Past Surgical History: No past surgical history on file. Social History: Social History   Socioeconomic History  . Marital status: Married    Spouse name: Not on file  . Number of children: Not on file  . Years of education: Not on file  . Highest education level: Not on file  Occupational History  . Not on file  Tobacco Use  . Smoking status: Never Smoker  . Smokeless tobacco: Never Used  Substance and Sexual Activity  . Alcohol use: Not on file  . Drug use: Not on file  . Sexual activity: Not on file  Other Topics Concern  . Not on file  Social History Narrative  . Not on file   Social Determinants of Health   Financial Resource Strain:   . Difficulty of Paying Living Expenses: Not on file  Food Insecurity:   . Worried About Programme researcher, broadcasting/film/video in the Last Year: Not on file  . Ran Out of Food in the Last Year: Not on file  Transportation Needs:   . Lack of Transportation (Medical): Not on file  . Lack of Transportation (Non-Medical): Not on file  Physical Activity:   . Days of Exercise per Week: Not on file  . Minutes of Exercise per Session: Not on file  Stress:   . Feeling of Stress : Not on file  Social Connections:   . Frequency of Communication with Friends and Family: Not on file  . Frequency of Social Gatherings with Friends and Family: Not on file  . Attends Religious Services: Not on file  . Active Member of Clubs or Organizations: Not on file  . Attends Banker Meetings: Not on file  . Marital Status: Not on file   Family History: No family history on file. Allergies: No Known  Allergies Medications: See med rec.  Review of Systems: No headache, visual changes, nausea, vomiting, diarrhea, constipation, dizziness, abdominal pain, skin rash, fevers, chills, night sweats, swollen lymph nodes, weight loss, chest pain, body aches, joint swelling, muscle aches, shortness of breath, mood changes, visual or auditory hallucinations.  Objective:    General: Well Developed, well nourished, and in no acute distress.  Neuro: Alert and oriented x3, extra-ocular muscles intact, sensation grossly intact. Cranial nerves II through XII are intact, motor, sensory, and coordinative functions are all intact. HEENT: Normocephalic, atraumatic, pupils equal round reactive to light, neck supple, no masses, no lymphadenopathy, thyroid nonpalpable. Oropharynx, nasopharynx, external ear canals are unremarkable. Skin: Warm and dry, no rashes noted.  Cardiac: Regular rate and rhythm, no murmurs rubs or gallops.  Respiratory: Clear to auscultation bilaterally. Not using accessory muscles, speaking in full sentences.  Abdominal: Soft, nontender, nondistended, positive bowel sounds, no masses, no organomegaly.  Musculoskeletal: Shoulder, elbow, wrist, hip, knee, ankle stable, and with full range of motion.  Impression and Recommendations:    The patient was counselled, risk factors were discussed, anticipatory guidance given.  Annual physical exam Annual physical exam, checking routine labs. We will start colon cancer screening next year.  Family history of prostate cancer in father PSA was checked last year, I would like to  check it every 2 years. Father was diagnosed in his 82s with prostate cancer, surgically cured.    ___________________________________________ Ihor Austin. Benjamin Stain, M.D., ABFM., CAQSM. Primary Care and Sports Medicine [place] [facility]  Adjunct Professor of Family Medicine  [employer]

## 2020-09-30 NOTE — Assessment & Plan Note (Signed)
Annual physical exam, checking routine labs. We will start colon cancer screening next year.

## 2021-05-25 ENCOUNTER — Other Ambulatory Visit: Payer: Self-pay

## 2021-05-25 ENCOUNTER — Ambulatory Visit (INDEPENDENT_AMBULATORY_CARE_PROVIDER_SITE_OTHER): Payer: Managed Care, Other (non HMO) | Admitting: Sports Medicine

## 2021-05-25 DIAGNOSIS — R2231 Localized swelling, mass and lump, right upper limb: Secondary | ICD-10-CM | POA: Diagnosis not present

## 2021-05-25 NOTE — Progress Notes (Signed)
    Procedures performed today:    None.  Independent interpretation of notes and tests performed by another provider:   None.  Brief History, Exam, Impression, and Recommendations:    Right wrist subcutaneous mass This is a pleasant 45 year old male, for several years he is noted a well-defined subcutaneous mass, nontender under the skin at his right wrist radial aspect. On exam is approximately a centimeter and a half across, well-defined, movable, this is either a sebaceous cyst or a lipoma, it does not bother him so we can leave it alone for now though he will discuss excision with his wife, if he decides to have it removed I am happy to do it in the office in a 30-minute slot.    ___________________________________________ Ihor Austin. Benjamin Stain, M.D., ABFM., CAQSM. Primary Care and Sports Medicine [place] [facility]  Adjunct Instructor of Family Medicine  [employer]

## 2021-05-25 NOTE — Assessment & Plan Note (Signed)
This is a pleasant 45 year old male, for several years he is noted a well-defined subcutaneous mass, nontender under the skin at his right wrist radial aspect. On exam is approximately a centimeter and a half across, well-defined, movable, this is either a sebaceous cyst or a lipoma, it does not bother him so we can leave it alone for now though he will discuss excision with his wife, if he decides to have it removed I am happy to do it in the office in a 30-minute slot.

## 2021-10-05 ENCOUNTER — Ambulatory Visit (INDEPENDENT_AMBULATORY_CARE_PROVIDER_SITE_OTHER): Payer: Managed Care, Other (non HMO) | Admitting: Sports Medicine

## 2021-10-05 ENCOUNTER — Other Ambulatory Visit: Payer: Self-pay

## 2021-10-05 VITALS — BP 116/72 | HR 53 | Wt 267.1 lb

## 2021-10-05 DIAGNOSIS — Z23 Encounter for immunization: Secondary | ICD-10-CM | POA: Diagnosis not present

## 2021-10-05 DIAGNOSIS — Z0000 Encounter for general adult medical examination without abnormal findings: Secondary | ICD-10-CM

## 2021-10-05 DIAGNOSIS — Z1211 Encounter for screening for malignant neoplasm of colon: Secondary | ICD-10-CM

## 2021-10-05 NOTE — Progress Notes (Signed)
  Subjective:    CC: Annual Physical Exam  HPI:  This patient is here for their annual physical  I reviewed the past medical history, family history, social history, surgical history, and allergies today and no changes were needed.  Please see the problem list section below in epic for further details.  Past Medical History: No past medical history on file. Past Surgical History: No past surgical history on file. Social History: Social History   Socioeconomic History   Marital status: Married    Spouse name: Not on file   Number of children: Not on file   Years of education: Not on file   Highest education level: Not on file  Occupational History   Not on file  Tobacco Use   Smoking status: Never   Smokeless tobacco: Never  Substance and Sexual Activity   Alcohol use: Not on file   Drug use: Not on file   Sexual activity: Not on file  Other Topics Concern   Not on file  Social History Narrative   Not on file   Social Determinants of Health   Financial Resource Strain: Not on file  Food Insecurity: Not on file  Transportation Needs: Not on file  Physical Activity: Not on file  Stress: Not on file  Social Connections: Not on file   Family History: No family history on file. Allergies: No Known Allergies Medications: See med rec.  Review of Systems: No headache, visual changes, nausea, vomiting, diarrhea, constipation, dizziness, abdominal pain, skin rash, fevers, chills, night sweats, swollen lymph nodes, weight loss, chest pain, body aches, joint swelling, muscle aches, shortness of breath, mood changes, visual or auditory hallucinations.  Objective:    General: Well Developed, well nourished, and in no acute distress.  Neuro: Alert and oriented x3, extra-ocular muscles intact, sensation grossly intact. Cranial nerves II through XII are intact, motor, sensory, and coordinative functions are all intact. HEENT: Normocephalic, atraumatic, pupils equal round  reactive to light, neck supple, no masses, no lymphadenopathy, thyroid nonpalpable. Oropharynx, nasopharynx, external ear canals are unremarkable. Skin: Warm and dry, no rashes noted.  Cardiac: Regular rate and rhythm, no murmurs rubs or gallops.  Respiratory: Clear to auscultation bilaterally. Not using accessory muscles, speaking in full sentences.  Abdominal: Soft, nontender, nondistended, positive bowel sounds, no masses, no organomegaly.  Musculoskeletal: Shoulder, elbow, wrist, hip, knee, ankle stable, and with full range of motion.  Impression and Recommendations:    The patient was counselled, risk factors were discussed, anticipatory guidance given.  Annual physical exam Annual physical as above, ordering Cologuard. Routine labs today. Flu shot today. Return in 1 year.   ___________________________________________ Ihor Austin. Benjamin Stain, M.D., ABFM., CAQSM. Primary Care and Sports Medicine [place] [facility]  Adjunct Professor of Family Medicine  [employer]

## 2021-10-05 NOTE — Assessment & Plan Note (Signed)
Annual physical as above, ordering Cologuard. Routine labs today. Flu shot today. Return in 1 year.

## 2021-10-06 LAB — LIPID PANEL
Cholesterol: 209 mg/dL — ABNORMAL HIGH (ref <200)
HDL: 45 mg/dL (ref >=40)
LDL Cholesterol (Calc): 148 mg/dL (calc) — ABNORMAL HIGH
Non-HDL Cholesterol (Calc): 164 mg/dL (calc) — ABNORMAL HIGH (ref <130)
Total CHOL/HDL Ratio: 4.6 (calc) (ref <5.0)
Triglycerides: 69 mg/dL (ref <150)

## 2021-10-06 LAB — COMPREHENSIVE METABOLIC PANEL
AG Ratio: 1.7 (calc) (ref 1.0–2.5)
ALT: 23 U/L (ref 9–46)
AST: 18 U/L (ref 10–40)
Albumin: 4.3 g/dL (ref 3.6–5.1)
Alkaline phosphatase (APISO): 53 U/L (ref 36–130)
BUN/Creatinine Ratio: 10 (calc) (ref 6–22)
BUN: 13 mg/dL (ref 7–25)
CO2: 30 mmol/L (ref 20–32)
Calcium: 9.5 mg/dL (ref 8.6–10.3)
Chloride: 104 mmol/L (ref 98–110)
Creat: 1.31 mg/dL — ABNORMAL HIGH (ref 0.60–1.29)
Globulin: 2.5 g/dL (calc) (ref 1.9–3.7)
Glucose, Bld: 90 mg/dL (ref 65–99)
Potassium: 4.7 mmol/L (ref 3.5–5.3)
Sodium: 138 mmol/L (ref 135–146)
Total Bilirubin: 0.6 mg/dL (ref 0.2–1.2)
Total Protein: 6.8 g/dL (ref 6.1–8.1)

## 2021-10-06 LAB — CBC
HCT: 44.1 % (ref 38.5–50.0)
Hemoglobin: 14.4 g/dL (ref 13.2–17.1)
MCH: 28.1 pg (ref 27.0–33.0)
MCHC: 32.7 g/dL (ref 32.0–36.0)
MCV: 86.1 fL (ref 80.0–100.0)
MPV: 12.8 fL — ABNORMAL HIGH (ref 7.5–12.5)
Platelets: 171 10*3/uL (ref 140–400)
RBC: 5.12 10*6/uL (ref 4.20–5.80)
RDW: 13 % (ref 11.0–15.0)
WBC: 4.5 10*3/uL (ref 3.8–10.8)

## 2021-10-06 LAB — TSH: TSH: 0.67 mIU/L (ref 0.40–4.50)

## 2022-10-11 ENCOUNTER — Ambulatory Visit (INDEPENDENT_AMBULATORY_CARE_PROVIDER_SITE_OTHER): Payer: 59 | Admitting: Sports Medicine

## 2022-10-11 ENCOUNTER — Encounter: Payer: Self-pay | Admitting: Sports Medicine

## 2022-10-11 VITALS — BP 133/85 | HR 61 | Ht 76.0 in | Wt 274.0 lb

## 2022-10-11 DIAGNOSIS — R5383 Other fatigue: Secondary | ICD-10-CM

## 2022-10-11 DIAGNOSIS — E291 Testicular hypofunction: Secondary | ICD-10-CM | POA: Diagnosis not present

## 2022-10-11 DIAGNOSIS — R030 Elevated blood-pressure reading, without diagnosis of hypertension: Secondary | ICD-10-CM

## 2022-10-11 DIAGNOSIS — Z0000 Encounter for general adult medical examination without abnormal findings: Secondary | ICD-10-CM

## 2022-10-11 DIAGNOSIS — R0989 Other specified symptoms and signs involving the circulatory and respiratory systems: Secondary | ICD-10-CM | POA: Insufficient documentation

## 2022-10-11 MED ORDER — PANTOPRAZOLE SODIUM 40 MG PO TBEC: 40.0000 mg | DELAYED_RELEASE_TABLET | Freq: Every day | ORAL | 3 refills | Status: DC

## 2022-10-11 NOTE — Assessment & Plan Note (Signed)
With mucus production. 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 Adding pantoprazole daily. Return in 6 weeks if not better.

## 2022-10-11 NOTE — Assessment & Plan Note (Signed)
Brandon Wood is a very busy man, as his kids are growing up there are more activities to go to, he is trying to work out but gets up at 3 AM to go to [employer] to work out. Unclear etiology, he has gained some weight. We will check testosterone levels as part of his routine labs. I am happy to do phentermine as well in the future if he desires.

## 2022-10-11 NOTE — Progress Notes (Signed)
  Subjective:    CC: Annual Physical Exam  HPI:  This patient is here for their annual physical  I reviewed the past medical history, family history, social history, surgical history, and allergies today and no changes were needed.  Please see the problem list section below in epic for further details.  Past Medical History: No past medical history on file. Past Surgical History: No past surgical history on file. Social History: Social History   Socioeconomic History   Marital status: Married    Spouse name: Not on file   Number of children: Not on file   Years of education: Not on file   Highest education level: Not on file  Occupational History   Not on file  Tobacco Use   Smoking status: Never   Smokeless tobacco: Never  Substance and Sexual Activity   Alcohol use: Not on file   Drug use: Not on file   Sexual activity: Not on file  Other Topics Concern   Not on file  Social History Narrative   Not on file   Social Determinants of Health   Financial Resource Strain: Not on file  Food Insecurity: Not on file  Transportation Needs: Not on file  Physical Activity: Not on file  Stress: Not on file  Social Connections: Not on file   Family History: No family history on file. Allergies: No Known Allergies Medications: See med rec.  Review of Systems: No headache, visual changes, nausea, vomiting, diarrhea, constipation, dizziness, abdominal pain, skin rash, fevers, chills, night sweats, swollen lymph nodes, weight loss, chest pain, body aches, joint swelling, muscle aches, shortness of breath, mood changes, visual or auditory hallucinations.  Objective:    General: Well Developed, well nourished, and in no acute distress.  Neuro: Alert and oriented x3, extra-ocular muscles intact, sensation grossly intact. Cranial nerves II through XII are intact, motor, sensory, and coordinative functions are all intact. HEENT: Normocephalic, atraumatic, pupils equal round  reactive to light, neck supple, no masses, no lymphadenopathy, thyroid nonpalpable. Oropharynx, nasopharynx, external ear canals are unremarkable. Skin: Warm and dry, no rashes noted.  Cardiac: Regular rate and rhythm, no murmurs rubs or gallops.  Respiratory: Clear to auscultation bilaterally. Not using accessory muscles, speaking in full sentences.  Abdominal: Soft, nontender, nondistended, positive bowel sounds, no masses, no organomegaly.  Musculoskeletal: Shoulder, elbow, wrist, hip, knee, ankle stable, and with full range of motion.  Impression and Recommendations:    The patient was counselled, risk factors were discussed, anticipatory guidance given.  Annual physical exam Annual physical as above. Ordering routine labs. Flu shot today. Return in a year.  Elevated blood pressure reading Improved on recheck.  Fatigue Brandon Wood is a very busy man, as his kids are growing up there are more activities to go to, he is trying to work out but gets up at 3 AM to go to [employer] to work out. Unclear etiology, he has gained some weight. We will check testosterone levels as part of his routine labs. I am happy to do phentermine as well in the future if he desires.  Throat clearing With mucus production. 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 Adding pantoprazole daily. Return in 6 weeks if not better.   ____________________________________________ Ihor Austin. Benjamin Stain, M.D., ABFM., CAQSM., AME. Primary Care and Sports Medicine [facility]  Adjunct Professor of Family Medicine  [employer] [facility] [employer]  Restaurant manager, fast food

## 2022-10-11 NOTE — Assessment & Plan Note (Signed)
Annual physical as above. Ordering routine labs. Flu shot today. Return in a year.

## 2022-10-11 NOTE — Assessment & Plan Note (Signed)
Improved on recheck.  

## 2022-10-15 LAB — COMPLETE METABOLIC PANEL WITH GFR
AG Ratio: 1.6 (calc) (ref 1.0–2.5)
ALT: 16 U/L (ref 9–46)
AST: 14 U/L (ref 10–40)
Albumin: 4.2 g/dL (ref 3.6–5.1)
Alkaline phosphatase (APISO): 55 U/L (ref 36–130)
BUN/Creatinine Ratio: 8 (calc) (ref 6–22)
BUN: 11 mg/dL (ref 7–25)
CO2: 29 mmol/L (ref 20–32)
Calcium: 9.2 mg/dL (ref 8.6–10.3)
Chloride: 104 mmol/L (ref 98–110)
Creat: 1.3 mg/dL — ABNORMAL HIGH (ref 0.60–1.29)
Globulin: 2.7 g/dL (calc) (ref 1.9–3.7)
Glucose, Bld: 83 mg/dL (ref 65–99)
Potassium: 4.4 mmol/L (ref 3.5–5.3)
Sodium: 138 mmol/L (ref 135–146)
Total Bilirubin: 0.7 mg/dL (ref 0.2–1.2)
Total Protein: 6.9 g/dL (ref 6.1–8.1)
eGFR: 69 mL/min/{1.73_m2} (ref >=60)

## 2022-10-15 LAB — CBC
HCT: 43.8 % (ref 38.5–50.0)
Hemoglobin: 15.1 g/dL (ref 13.2–17.1)
MCH: 29.2 pg (ref 27.0–33.0)
MCHC: 34.5 g/dL (ref 32.0–36.0)
MCV: 84.7 fL (ref 80.0–100.0)
MPV: 13.1 fL — ABNORMAL HIGH (ref 7.5–12.5)
Platelets: 152 10*3/uL (ref 140–400)
RBC: 5.17 10*6/uL (ref 4.20–5.80)
RDW: 12.6 % (ref 11.0–15.0)
WBC: 3.4 10*3/uL — ABNORMAL LOW (ref 3.8–10.8)

## 2022-10-15 LAB — TESTOSTERONE, FREE & TOTAL
Free Testosterone: 67.8 pg/mL (ref 35.0–155.0)
Testosterone, Total, LC-MS-MS: 560 ng/dL (ref 250–1100)

## 2022-10-15 LAB — LIPID PANEL
Cholesterol: 196 mg/dL (ref <200)
HDL: 46 mg/dL (ref >=40)
LDL Cholesterol (Calc): 135 mg/dL (calc) — ABNORMAL HIGH
Non-HDL Cholesterol (Calc): 150 mg/dL (calc) — ABNORMAL HIGH (ref <130)
Total CHOL/HDL Ratio: 4.3 (calc) (ref <5.0)
Triglycerides: 62 mg/dL (ref <150)

## 2022-10-15 LAB — HEMOGLOBIN A1C
Hgb A1c MFr Bld: 5.6 % of total Hgb (ref <5.7)
Mean Plasma Glucose: 114 mg/dL
eAG (mmol/L): 6.3 mmol/L

## 2022-10-15 LAB — TSH: TSH: 1.56 mIU/L (ref 0.40–4.50)

## 2022-10-18 ENCOUNTER — Encounter (INDEPENDENT_AMBULATORY_CARE_PROVIDER_SITE_OTHER): Payer: 59 | Admitting: Sports Medicine

## 2022-10-18 DIAGNOSIS — E782 Mixed hyperlipidemia: Secondary | ICD-10-CM | POA: Diagnosis not present

## 2022-10-18 NOTE — Telephone Encounter (Signed)
I spent 5 total minutes of online digital evaluation and management services in this patient-initiated request for online care. 

## 2022-11-30 ENCOUNTER — Encounter: Payer: Self-pay | Admitting: Sports Medicine

## 2022-11-30 ENCOUNTER — Ambulatory Visit: Payer: 59 | Admitting: Sports Medicine

## 2022-11-30 VITALS — BP 121/82 | HR 90 | Wt 274.0 lb

## 2022-11-30 DIAGNOSIS — Z23 Encounter for immunization: Secondary | ICD-10-CM | POA: Diagnosis not present

## 2022-11-30 DIAGNOSIS — E782 Mixed hyperlipidemia: Secondary | ICD-10-CM

## 2022-11-30 DIAGNOSIS — R0989 Other specified symptoms and signs involving the circulatory and respiratory systems: Secondary | ICD-10-CM

## 2022-11-30 NOTE — Assessment & Plan Note (Signed)
Mildly elevated hyperlipidemia, we had a discussion about cholesterol, as well as the pathophysiology of atherosclerosis and statin medications. He will do a low-cholesterol diet, we can recheck in about 3 months and if still above 100, even 70 we will consider addition of a statin.

## 2022-11-30 NOTE — Assessment & Plan Note (Signed)
Resolved with pantoprazole, may discontinue for now.

## 2022-11-30 NOTE — Progress Notes (Signed)
    Procedures performed today:    None.  Independent interpretation of notes and tests performed by another provider:   None.  Brief History, Exam, Impression, and Recommendations:    Mixed hyperlipidemia Mildly elevated hyperlipidemia, we had a discussion about cholesterol, as well as the pathophysiology of atherosclerosis and statin medications. He will do a low-cholesterol diet, we can recheck in about 3 months and if still above 100, even 70 we will consider addition of a statin.  Throat clearing Resolved with pantoprazole, may discontinue for now.  I spent 30 minutes of total time managing this patient today, this includes chart review, face to face, and non-face to face time.  ____________________________________________ Gwen Her. Dianah Field, M.D., ABFM., CAQSM., AME. Primary Care and Sports Medicine [place] [facility]  Adjunct Professor of [employer] [facility] [employer]  Risk manager

## 2024-01-23 ENCOUNTER — Encounter: Payer: 59 | Admitting: Sports Medicine

## 2024-02-27 ENCOUNTER — Ambulatory Visit (INDEPENDENT_AMBULATORY_CARE_PROVIDER_SITE_OTHER): Admitting: Sports Medicine

## 2024-02-27 ENCOUNTER — Encounter: Payer: Self-pay | Admitting: Sports Medicine

## 2024-02-27 VITALS — BP 91/53 | HR 62 | Resp 20 | Ht 76.0 in | Wt 270.2 lb

## 2024-02-27 DIAGNOSIS — Z0000 Encounter for general adult medical examination without abnormal findings: Secondary | ICD-10-CM | POA: Diagnosis not present

## 2024-02-27 DIAGNOSIS — Z23 Encounter for immunization: Secondary | ICD-10-CM

## 2024-02-27 DIAGNOSIS — E291 Testicular hypofunction: Secondary | ICD-10-CM | POA: Diagnosis not present

## 2024-02-27 DIAGNOSIS — R739 Hyperglycemia, unspecified: Secondary | ICD-10-CM

## 2024-02-27 DIAGNOSIS — Z1211 Encounter for screening for malignant neoplasm of colon: Secondary | ICD-10-CM

## 2024-02-27 DIAGNOSIS — E782 Mixed hyperlipidemia: Secondary | ICD-10-CM | POA: Diagnosis not present

## 2024-02-27 NOTE — Addendum Note (Signed)
 Addended by: Samule Dry on: 02/27/2024 09:55 AM   Modules accepted: Orders

## 2024-02-27 NOTE — Addendum Note (Signed)
 Addended by: Monica Becton on: 02/27/2024 09:30 AM   Modules accepted: Orders

## 2024-02-27 NOTE — Progress Notes (Signed)
  Subjective:    CC: Annual Physical Exam  HPI:  This patient is here for their annual physical  I reviewed the past medical history, family history, social history, surgical history, and allergies today and no changes were needed.  Please see the problem list section below in epic for further details.  Past Medical History: History reviewed. No pertinent past medical history. Past Surgical History: History reviewed. No pertinent surgical history. Social History: Social History   Socioeconomic History   Marital status: Married    Spouse name: Not on file   Number of children: Not on file   Years of education: Not on file   Highest education level: Not on file  Occupational History   Not on file  Tobacco Use   Smoking status: Never   Smokeless tobacco: Never  Substance and Sexual Activity   Alcohol use: Not on file   Drug use: Not on file   Sexual activity: Not on file  Other Topics Concern   Not on file  Social History Narrative   Not on file   Social Drivers of Health   Financial Resource Strain: Not on file  Food Insecurity: Not on file  Transportation Needs: Not on file  Physical Activity: Not on file  Stress: Not on file  Social Connections: Not on file   Family History: History reviewed. No pertinent family history. Allergies: No Known Allergies Medications: See med rec.  Review of Systems: No headache, visual changes, nausea, vomiting, diarrhea, constipation, dizziness, abdominal pain, skin rash, fevers, chills, night sweats, swollen lymph nodes, weight loss, chest pain, body aches, joint swelling, muscle aches, shortness of breath, mood changes, visual or auditory hallucinations.  Objective:    General: Well Developed, well nourished, and in no acute distress.  Neuro: Alert and oriented x3, extra-ocular muscles intact, sensation grossly intact. Cranial nerves II through XII are intact, motor, sensory, and coordinative functions are all intact. HEENT:  Normocephalic, atraumatic, pupils equal round reactive to light, neck supple, no masses, no lymphadenopathy, thyroid nonpalpable. Oropharynx, nasopharynx, external ear canals are unremarkable. Skin: Warm and dry, no rashes noted.  Cardiac: Regular rate and rhythm, no murmurs rubs or gallops.  Respiratory: Clear to auscultation bilaterally. Not using accessory muscles, speaking in full sentences.  Abdominal: Soft, nontender, nondistended, positive bowel sounds, no masses, no organomegaly.  Musculoskeletal: Shoulder, elbow, wrist, hip, knee, ankle stable, and with full range of motion.  Impression and Recommendations:    The patient was counselled, risk factors were discussed, anticipatory guidance given.  No problem-specific Assessment & Plan notes found for this encounter.   ____________________________________________ Ihor Austin. Benjamin Stain, M.D., ABFM., CAQSM., AME. Primary Care and Sports Medicine [place] [facility]  Adjunct Professor of Family Medicine  [employer] [facility] [employer]  Restaurant manager, fast food

## 2024-02-27 NOTE — Assessment & Plan Note (Signed)
 Annual physical as above, ordering Cologuard, Tdap today. Return to see me in a year.

## 2024-02-28 LAB — LIPID PANEL
Chol/HDL Ratio: 4.5 ratio (ref 0.0–5.0)
Cholesterol, Total: 189 mg/dL (ref 100–199)
HDL: 42 mg/dL (ref >39)
LDL Chol Calc (NIH): 136 mg/dL — ABNORMAL HIGH (ref 0–99)
Triglycerides: 57 mg/dL (ref 0–149)
VLDL Cholesterol Cal: 11 mg/dL (ref 5–40)

## 2024-02-28 LAB — CBC
Hematocrit: 42 % (ref 37.5–51.0)
Hemoglobin: 13.8 g/dL (ref 13.0–17.7)
MCH: 28.5 pg (ref 26.6–33.0)
MCHC: 32.9 g/dL (ref 31.5–35.7)
MCV: 87 fL (ref 79–97)
Platelets: 150 10*3/uL (ref 150–450)
RBC: 4.84 x10E6/uL (ref 4.14–5.80)
RDW: 12.7 % (ref 11.6–15.4)
WBC: 3.6 10*3/uL (ref 3.4–10.8)

## 2024-02-28 LAB — COMPREHENSIVE METABOLIC PANEL WITH GFR
ALT: 20 IU/L (ref 0–44)
AST: 23 IU/L (ref 0–40)
Albumin: 4.1 g/dL (ref 4.1–5.1)
Alkaline Phosphatase: 60 IU/L (ref 44–121)
BUN/Creatinine Ratio: 9 (ref 9–20)
BUN: 12 mg/dL (ref 6–24)
Bilirubin Total: 0.4 mg/dL (ref 0.0–1.2)
CO2: 22 mmol/L (ref 20–29)
Calcium: 9.2 mg/dL (ref 8.7–10.2)
Chloride: 105 mmol/L (ref 96–106)
Creatinine, Ser: 1.35 mg/dL — ABNORMAL HIGH (ref 0.76–1.27)
Globulin, Total: 2.3 g/dL (ref 1.5–4.5)
Glucose: 92 mg/dL (ref 70–99)
Potassium: 4.4 mmol/L (ref 3.5–5.2)
Sodium: 140 mmol/L (ref 134–144)
Total Protein: 6.4 g/dL (ref 6.0–8.5)
eGFR: 65 mL/min/{1.73_m2} (ref >59)

## 2024-02-28 LAB — HEMOGLOBIN A1C
Est. average glucose Bld gHb Est-mCnc: 117 mg/dL
Hgb A1c MFr Bld: 5.7 % — ABNORMAL HIGH (ref 4.8–5.6)

## 2024-02-28 LAB — PSA, TOTAL AND FREE
PSA, Free Pct: 25.3 %
PSA, Free: 0.48 ng/mL
Prostate Specific Ag, Serum: 1.9 ng/mL (ref 0.0–4.0)

## 2024-02-28 LAB — TSH: TSH: 0.578 u[IU]/mL (ref 0.450–4.500)

## 2024-03-02 ENCOUNTER — Encounter: Payer: Self-pay | Admitting: *Deleted

## 2024-03-21 LAB — COLOGUARD: COLOGUARD: NEGATIVE

## 2024-07-31 ENCOUNTER — Encounter: Payer: Self-pay | Admitting: Sports Medicine

## 2025-02-18 ENCOUNTER — Encounter: Admitting: Family Medicine

## 2025-03-04 ENCOUNTER — Encounter: Admitting: Medical-Surgical

## 2025-03-04 ENCOUNTER — Encounter: Admitting: Sports Medicine
